# Patient Record
Sex: Female | Born: 1975 | Race: Black or African American | Hispanic: No | Smoking: Never smoker
Health system: Southern US, Community
[De-identification: ages and names within clinical notes are randomized; demographics above are authoritative.]

## PROBLEM LIST (undated history)

## (undated) DIAGNOSIS — J45909 Unspecified asthma, uncomplicated: Secondary | ICD-10-CM

## (undated) DIAGNOSIS — I82409 Acute embolism and thrombosis of unspecified deep veins of unspecified lower extremity: Secondary | ICD-10-CM

## (undated) DIAGNOSIS — E119 Type 2 diabetes mellitus without complications: Secondary | ICD-10-CM

## (undated) DIAGNOSIS — J40 Bronchitis, not specified as acute or chronic: Secondary | ICD-10-CM

## (undated) DIAGNOSIS — E669 Obesity, unspecified: Secondary | ICD-10-CM

## (undated) HISTORY — PX: HERNIA REPAIR: SHX51

---

## 1998-01-28 ENCOUNTER — Emergency Department (HOSPITAL_COMMUNITY): Admission: EM | Admit: 1998-01-28 | Discharge: 1998-01-29 | Payer: Self-pay | Admitting: Emergency Medicine

## 1998-11-08 ENCOUNTER — Emergency Department (HOSPITAL_COMMUNITY): Admission: EM | Admit: 1998-11-08 | Discharge: 1998-11-08 | Payer: Self-pay | Admitting: Emergency Medicine

## 1998-11-08 ENCOUNTER — Encounter: Payer: Self-pay | Admitting: Emergency Medicine

## 1999-02-23 ENCOUNTER — Encounter: Payer: Self-pay | Admitting: Emergency Medicine

## 1999-02-23 ENCOUNTER — Emergency Department (HOSPITAL_COMMUNITY): Admission: EM | Admit: 1999-02-23 | Discharge: 1999-02-23 | Payer: Self-pay | Admitting: Emergency Medicine

## 2001-09-13 ENCOUNTER — Emergency Department (HOSPITAL_COMMUNITY): Admission: EM | Admit: 2001-09-13 | Discharge: 2001-09-13 | Payer: Self-pay | Admitting: Emergency Medicine

## 2001-09-14 ENCOUNTER — Emergency Department (HOSPITAL_COMMUNITY): Admission: EM | Admit: 2001-09-14 | Discharge: 2001-09-14 | Payer: Self-pay | Admitting: Emergency Medicine

## 2003-06-03 ENCOUNTER — Observation Stay (HOSPITAL_COMMUNITY): Admission: AD | Admit: 2003-06-03 | Discharge: 2003-06-05 | Payer: Self-pay | Admitting: Family Medicine

## 2003-06-09 ENCOUNTER — Encounter: Admission: RE | Admit: 2003-06-09 | Discharge: 2003-06-09 | Payer: Self-pay | Admitting: Family Medicine

## 2003-11-27 ENCOUNTER — Emergency Department (HOSPITAL_COMMUNITY): Admission: EM | Admit: 2003-11-27 | Discharge: 2003-11-27 | Payer: Self-pay | Admitting: Emergency Medicine

## 2004-01-05 ENCOUNTER — Ambulatory Visit (HOSPITAL_COMMUNITY): Admission: RE | Admit: 2004-01-05 | Discharge: 2004-01-05 | Payer: Self-pay | Admitting: Family Medicine

## 2004-01-11 ENCOUNTER — Emergency Department (HOSPITAL_COMMUNITY): Admission: EM | Admit: 2004-01-11 | Discharge: 2004-01-12 | Payer: Self-pay

## 2004-07-19 ENCOUNTER — Emergency Department (HOSPITAL_COMMUNITY): Admission: EM | Admit: 2004-07-19 | Discharge: 2004-07-19 | Payer: Self-pay | Admitting: Emergency Medicine

## 2004-10-08 ENCOUNTER — Emergency Department (HOSPITAL_COMMUNITY): Admission: AD | Admit: 2004-10-08 | Discharge: 2004-10-08 | Payer: Self-pay | Admitting: Family Medicine

## 2005-03-20 ENCOUNTER — Emergency Department (HOSPITAL_COMMUNITY): Admission: EM | Admit: 2005-03-20 | Discharge: 2005-03-20 | Payer: Self-pay | Admitting: *Deleted

## 2005-04-04 ENCOUNTER — Other Ambulatory Visit: Admission: RE | Admit: 2005-04-04 | Discharge: 2005-04-04 | Payer: Self-pay | Admitting: Gynecology

## 2005-08-25 ENCOUNTER — Emergency Department (HOSPITAL_COMMUNITY): Admission: EM | Admit: 2005-08-25 | Discharge: 2005-08-26 | Payer: Self-pay | Admitting: Emergency Medicine

## 2006-01-18 ENCOUNTER — Emergency Department (HOSPITAL_COMMUNITY): Admission: EM | Admit: 2006-01-18 | Discharge: 2006-01-18 | Payer: Self-pay | Admitting: Emergency Medicine

## 2006-08-03 ENCOUNTER — Emergency Department (HOSPITAL_COMMUNITY): Admission: EM | Admit: 2006-08-03 | Discharge: 2006-08-03 | Payer: Self-pay | Admitting: Emergency Medicine

## 2006-08-23 ENCOUNTER — Emergency Department (HOSPITAL_COMMUNITY): Admission: EM | Admit: 2006-08-23 | Discharge: 2006-08-23 | Payer: Self-pay | Admitting: Emergency Medicine

## 2006-12-05 ENCOUNTER — Emergency Department: Payer: Self-pay | Admitting: Unknown Physician Specialty

## 2006-12-05 ENCOUNTER — Other Ambulatory Visit: Payer: Self-pay

## 2007-02-28 ENCOUNTER — Emergency Department: Payer: Self-pay | Admitting: Emergency Medicine

## 2007-04-06 ENCOUNTER — Emergency Department: Payer: Self-pay | Admitting: Emergency Medicine

## 2007-06-03 ENCOUNTER — Emergency Department: Payer: Self-pay | Admitting: Emergency Medicine

## 2013-09-25 ENCOUNTER — Emergency Department (HOSPITAL_COMMUNITY)
Admission: EM | Admit: 2013-09-25 | Discharge: 2013-09-26 | Disposition: A | Discharge status: Home / Self Care | Payer: Self-pay | Attending: Emergency Medicine | Admitting: Emergency Medicine

## 2013-09-25 ENCOUNTER — Encounter (HOSPITAL_COMMUNITY): Payer: Self-pay | Admitting: Emergency Medicine

## 2013-09-25 DIAGNOSIS — J302 Other seasonal allergic rhinitis: Secondary | ICD-10-CM

## 2013-09-25 DIAGNOSIS — E119 Type 2 diabetes mellitus without complications: Secondary | ICD-10-CM | POA: Insufficient documentation

## 2013-09-25 DIAGNOSIS — J40 Bronchitis, not specified as acute or chronic: Secondary | ICD-10-CM

## 2013-09-25 DIAGNOSIS — Z91199 Patient's noncompliance with other medical treatment and regimen due to unspecified reason: Secondary | ICD-10-CM | POA: Insufficient documentation

## 2013-09-25 DIAGNOSIS — J309 Allergic rhinitis, unspecified: Secondary | ICD-10-CM | POA: Insufficient documentation

## 2013-09-25 DIAGNOSIS — Z9119 Patient's noncompliance with other medical treatment and regimen: Secondary | ICD-10-CM | POA: Insufficient documentation

## 2013-09-25 DIAGNOSIS — Z880 Allergy status to penicillin: Secondary | ICD-10-CM | POA: Insufficient documentation

## 2013-09-25 DIAGNOSIS — Z79899 Other long term (current) drug therapy: Secondary | ICD-10-CM | POA: Insufficient documentation

## 2013-09-25 DIAGNOSIS — E669 Obesity, unspecified: Secondary | ICD-10-CM | POA: Insufficient documentation

## 2013-09-25 DIAGNOSIS — J45901 Unspecified asthma with (acute) exacerbation: Secondary | ICD-10-CM | POA: Insufficient documentation

## 2013-09-25 HISTORY — DX: Type 2 diabetes mellitus without complications: E11.9

## 2013-09-25 HISTORY — DX: Unspecified asthma, uncomplicated: J45.909

## 2013-09-25 HISTORY — DX: Obesity, unspecified: E66.9

## 2013-09-25 HISTORY — DX: Bronchitis, not specified as acute or chronic: J40

## 2013-09-25 MED ORDER — IPRATROPIUM BROMIDE 0.02 % IN SOLN
0.5000 mg | Freq: Once | RESPIRATORY_TRACT | Status: AC
  Administered 2013-09-26: 0.5 mg via RESPIRATORY_TRACT
  Filled 2013-09-25: qty 2.5

## 2013-09-25 MED ORDER — ALBUTEROL SULFATE (2.5 MG/3ML) 0.083% IN NEBU
5.0000 mg | INHALATION_SOLUTION | Freq: Once | RESPIRATORY_TRACT | Status: AC
  Administered 2013-09-26: 5 mg via RESPIRATORY_TRACT
  Filled 2013-09-25: qty 6

## 2013-09-25 MED ORDER — HYDROCOD POLST-CHLORPHEN POLST 10-8 MG/5ML PO LQCR
5.0000 mL | Freq: Once | ORAL | Status: AC
  Administered 2013-09-26: 5 mL via ORAL
  Filled 2013-09-25: qty 5

## 2013-09-25 NOTE — ED Notes (Signed)
Pt. reports productive cough with chest congestion , nasal congestion , itchy eyes and headache for several days .

## 2013-09-25 NOTE — ED Provider Notes (Signed)
CSN: [id number]     Arrival date & time 09/25/13  2221 History  This chart was scribed for non-physician practitioner, Marie Haring, PA-C working with Marie Greek, MD by Marie Spears, ED scribe. This patient was seen in room TR07C/TR07C and the patient's care was started at 11:42 PM.    Chief Complaint  Patient presents with  . Cough  . Nasal Congestion   The history is provided by the patient. No language interpreter was used.   HPI Comments: Marie Spears is a 38 y.o. Morbidly obese female with history of asthma who presents to the Emergency Department complaining of productive cough, chest congestion, nasal congestion, itchy eyes and headache that started 5 days ago. She is also starting to have a sore throat. Pt states she is visiting from [place] and left her inhaler at home. She denies having difficulty breathing, chest pains, abdominal pains, fatigue, recent weight gain or lower extremity swelling. + sneezing, + coughing, + nasal congestion  Past Medical History  Diagnosis Date  . Asthma   . Diabetes mellitus without complication   . Obesity   . Bronchitis    Past Surgical History  Procedure Laterality Date  . Hernia repair     No family history on file. History  Substance Use Topics  . Smoking status: Never Smoker   . Smokeless tobacco: Not on file  . Alcohol Use: No   OB History   Grav Para Term Preterm Abortions TAB SAB Ect Mult Living                 Review of Systems  HENT: Positive for congestion and sore throat.   Eyes: Positive for itching.  Respiratory: Positive for cough.   Neurological: Positive for headaches.  All other systems reviewed and are negative.   Allergies  Penicillins  Home Medications   Current Outpatient Rx  Name  Route  Sig  Dispense  Refill  . albuterol (PROVENTIL HFA;VENTOLIN HFA) 108 (90 BASE) MCG/ACT inhaler   Inhalation   Inhale 2 puffs into the lungs every 6 (six) hours as needed for wheezing or shortness  of breath.         . loratadine (CLARITIN) 10 MG tablet   Oral   Take 1 tablet (10 mg total) by mouth daily.   30 tablet   0   . sulfamethoxazole-trimethoprim (BACTRIM) 400-80 MG per tablet   Oral   Take 1 tablet by mouth 2 (two) times daily.   14 tablet   0     BP 136/78  Pulse 88  Temp(Src) 98.7 F (37.1 C) (Oral)  Resp 14  Ht 6' (1.829 m)  Wt 351 lb (159.213 kg)  BMI 47.59 kg/m2  SpO2 98%  LMP 09/15/2013  Physical Exam  Nursing note and vitals reviewed. Constitutional: She is oriented to person, place, and time. She appears well-developed and well-nourished. No distress.  HENT:  Head: Normocephalic and atraumatic.  Right Ear: Tympanic membrane and ear canal normal.  Left Ear: Tympanic membrane and ear canal normal.  Nose: Rhinorrhea present.  Mouth/Throat: Uvula is midline and mucous membranes are normal. Posterior oropharyngeal erythema present. No oropharyngeal exudate or posterior oropharyngeal edema.  Eyes: EOM are normal.  Neck: Neck supple. No tracheal deviation present.  Cardiovascular: Normal rate, regular rhythm and normal heart sounds.   Pulmonary/Chest: Effort normal. No respiratory distress. She has wheezes (mild diffuse). She has no rales.  Abdominal: Bowel sounds are normal. There is no tenderness. There is no rebound.  Musculoskeletal: Normal range of motion.  No lower extremity swelling.   Neurological: She is alert and oriented to person, place, and time.  Skin: Skin is warm and dry.  Psychiatric: She has a normal mood and affect. Her behavior is normal.    ED Course  Procedures (including critical care time)  DIAGNOSTIC STUDIES: Oxygen Saturation is 98% on RA, normal by my interpretation.    COORDINATION OF CARE: 11:44 PM-Discussed treatment plan which includes a breathing treatment and cough medicine with pt at bedside and pt agreed to plan.   Labs Review Labs Reviewed - No data to display Imaging Review No results found.   EKG  Interpretation None      MDM   Final diagnoses:  Asthma exacerbation  Seasonal allergies  Bronchitis    Pt given a breathing treatment in the ER. She is a diabetic and reports being non compliant and not checking her sugar at home. She also did not bring her glucometer, therefore, since her wheezing is mild, no prednisone given. She was given two breathing treatment. The first one relieved the wheezing, the patient did not medically need the second treatment but she requested it.  Pt has the coughing and allergy symptoms but also describes URI, due to her being diabetic and out of town, will place on abx. She is allergic to Penicillin and reports she is a self pay and needs abx for money reasons. This is why Bactrim was chosen. Albuterol inhaler and claritin, Rx.    38 y.o.Marie Spears's evaluation in the Emergency Department is complete. It has been determined that no acute conditions requiring further emergency intervention are present at this time. The patient/guardian have been advised of the diagnosis and plan. We have discussed signs and symptoms that warrant return to the ED, such as changes or worsening in symptoms.  Vital signs are stable at discharge. Filed Vitals:   09/26/13 0112  BP: 124/62  Pulse: 84  Temp: 97.9 F (36.6 C)  Resp: 22    Patient/guardian has voiced understanding and agreed to follow-up with the PCP or specialist.  I personally performed the services described in this documentation, which was scribed in my presence. The recorded information has been reviewed and is accurate.  Marie Mako, PA-C 09/29/13 1008

## 2013-09-26 MED ORDER — LORATADINE 10 MG PO TABS: 10.0000 mg | ORAL_TABLET | Freq: Every day | ORAL | Status: DC

## 2013-09-26 MED ORDER — ALBUTEROL SULFATE HFA 108 (90 BASE) MCG/ACT IN AERS
2.0000 | INHALATION_SPRAY | RESPIRATORY_TRACT | Status: DC | PRN
  Administered 2013-09-26: 2 via RESPIRATORY_TRACT
  Filled 2013-09-26: qty 6.7

## 2013-09-26 MED ORDER — IPRATROPIUM BROMIDE 0.02 % IN SOLN
0.5000 mg | Freq: Once | RESPIRATORY_TRACT | Status: AC
  Administered 2013-09-26: 0.5 mg via RESPIRATORY_TRACT
  Filled 2013-09-26: qty 2.5

## 2013-09-26 MED ORDER — ALBUTEROL SULFATE (2.5 MG/3ML) 0.083% IN NEBU
5.0000 mg | INHALATION_SOLUTION | Freq: Once | RESPIRATORY_TRACT | Status: AC
  Administered 2013-09-26: 5 mg via RESPIRATORY_TRACT
  Filled 2013-09-26: qty 6

## 2013-09-26 MED ORDER — DEXAMETHASONE SODIUM PHOSPHATE 10 MG/ML IJ SOLN
10.0000 mg | Freq: Once | INTRAMUSCULAR | Status: AC
  Administered 2013-09-26: 10 mg via INTRAMUSCULAR
  Filled 2013-09-26: qty 1

## 2013-09-26 MED ORDER — SULFAMETHOXAZOLE-TRIMETHOPRIM 400-80 MG PO TABS: 1.0000 | ORAL_TABLET | Freq: Two times a day (BID) | ORAL | Status: DC

## 2013-09-26 NOTE — Discharge Instructions (Signed)
Upper Respiratory Infection, Adult An upper respiratory infection (URI) is also sometimes known as the common cold. The upper respiratory tract includes the nose, sinuses, throat, trachea, and bronchi. Bronchi are the airways leading to the lungs. Most people improve within 1 week, but symptoms can last up to 2 weeks. A residual cough may last even longer.  CAUSES Many different viruses can infect the tissues lining the upper respiratory tract. The tissues become irritated and inflamed and often become very moist. Mucus production is also common. A cold is contagious. You can easily spread the virus to others by oral contact. This includes kissing, sharing a glass, coughing, or sneezing. Touching your mouth or nose and then touching a surface, which is then touched by another person, can also spread the virus. SYMPTOMS  Symptoms typically develop 1 to 3 days after you come in contact with a cold virus. Symptoms vary from person to person. They may include:  Runny nose.  Sneezing.  Nasal congestion.  Sinus irritation.  Sore throat.  Loss of voice (laryngitis).  Cough.  Fatigue.  Muscle aches.  Loss of appetite.  Headache.  Low-grade fever. DIAGNOSIS  You might diagnose your own cold based on familiar symptoms, since most people get a cold 2 to 3 times a year. Your caregiver can confirm this based on your exam. Most importantly, your caregiver can check that your symptoms are not due to another disease such as strep throat, sinusitis, pneumonia, asthma, or epiglottitis. Blood tests, throat tests, and X-rays are not necessary to diagnose a common cold, but they may sometimes be helpful in excluding other more serious diseases. Your caregiver will decide if any further tests are required. RISKS AND COMPLICATIONS  You may be at risk for a more severe case of the common cold if you smoke cigarettes, have chronic heart disease (such as heart failure) or lung disease (such as asthma), or if  you have a weakened immune system. The very young and very old are also at risk for more serious infections. Bacterial sinusitis, middle ear infections, and bacterial pneumonia can complicate the common cold. The common cold can worsen asthma and chronic obstructive pulmonary disease (COPD). Sometimes, these complications can require emergency medical care and may be life-threatening. PREVENTION  The best way to protect against getting a cold is to practice good hygiene. Avoid oral or hand contact with people with cold symptoms. Wash your hands often if contact occurs. There is no clear evidence that vitamin C, vitamin E, echinacea, or exercise reduces the chance of developing a cold. However, it is always recommended to get plenty of rest and practice good nutrition. TREATMENT  Treatment is directed at relieving symptoms. There is no cure. Antibiotics are not effective, because the infection is caused by a virus, not by bacteria. Treatment may include:  Increased fluid intake. Sports drinks offer valuable electrolytes, sugars, and fluids.  Breathing heated mist or steam (vaporizer or shower).  Eating chicken soup or other clear broths, and maintaining good nutrition.  Getting plenty of rest.  Using gargles or lozenges for comfort.  Controlling fevers with ibuprofen or acetaminophen as directed by your caregiver.  Increasing usage of your inhaler if you have asthma. Zinc gel and zinc lozenges, taken in the first 24 hours of the common cold, can shorten the duration and lessen the severity of symptoms. Pain medicines may help with fever, muscle aches, and throat pain. A variety of non-prescription medicines are available to treat congestion and runny nose. Your caregiver  duration and lessen the severity of symptoms. Pain medicines may help with fever, muscle aches, and throat pain. A variety of non-prescription medicines are available to treat congestion and runny nose. Your caregiver can make recommendations and may suggest nasal or lung inhalers for other symptoms.   HOME CARE INSTRUCTIONS    Only take over-the-counter or prescription medicines for pain, discomfort, or fever as directed by your  caregiver.   Use a warm mist humidifier or inhale steam from a shower to increase air moisture. This may keep secretions moist and make it easier to breathe.   Drink enough water and fluids to keep your urine clear or pale yellow.   Rest as needed.   Return to work when your temperature has returned to normal or as your caregiver advises. You may need to stay home longer to avoid infecting others. You can also use a face mask and careful hand washing to prevent spread of the virus.  SEEK MEDICAL CARE IF:    After the first few days, you feel you are getting worse rather than better.   You need your caregiver's advice about medicines to control symptoms.   You develop chills, worsening shortness of breath, or brown or red sputum. These may be signs of pneumonia.   You develop yellow or brown nasal discharge or pain in the face, especially when you bend forward. These may be signs of sinusitis.   You develop a fever, swollen neck glands, pain with swallowing, or white areas in the back of your throat. These may be signs of strep throat.  SEEK IMMEDIATE MEDICAL CARE IF:    You have a fever.   You develop severe or persistent headache, ear pain, sinus pain, or chest pain.   You develop wheezing, a prolonged cough, cough up blood, or have a change in your usual mucus (if you have chronic lung disease).   You develop sore muscles or a stiff neck.  Document Released: 12/06/2000 Document Revised: 09/04/2011 Document Reviewed: 10/14/2010  [employer] Patient Information 2014 [employer]  Asthma, Adult  Asthma is a recurring condition in which the airways tighten and narrow. Asthma can make it difficult to breathe. It can cause coughing, wheezing, and shortness of breath. Asthma episodes (also called asthma attacks) range from minor to life-threatening. Asthma cannot be cured, but medicines and lifestyle changes can help control it.  CAUSES  Asthma is believed to be caused by inherited (genetic) and  environmental factors, but its exact cause is unknown. Asthma may be triggered by allergens, lung infections, or irritants in the air. Asthma triggers are different for each person. Common triggers include:    Animal dander.   Dust mites.   Cockroaches.   Pollen from trees or grass.   Mold.   Smoke.   Air pollutants such as dust, household cleaners, hair sprays, aerosol sprays, paint fumes, strong chemicals, or strong odors.   Cold air, weather changes, and winds (which increase molds and pollens in the air).   Strong emotional expressions such as crying or laughing hard.   Stress.   Certain medicines (such as aspirin) or types of drugs (such as beta-blockers).   Sulfites in foods and drinks. Foods and drinks that may contain sulfites include dried fruit, potato chips, and sparkling grape juice.   Infections or inflammatory conditions such as the flu, a cold, or an inflammation of the nasal membranes (rhinitis).   Gastroesophageal reflux disease (GERD).   Exercise or strenuous activity.  SYMPTOMS  Symptoms   much air you breath in and out.  Allergy tests.  Imaging tests such as X-rays. TREATMENT  Asthma cannot be cured, but it can usually be controlled. Treatment involves identifying and avoiding your asthma triggers. It also involves medicines. There are 2 classes of medicine used for asthma treatment:   Controller medicines. These prevent asthma symptoms from occurring. They are usually taken every day.  Reliever or rescue medicines. These quickly relieve  asthma symptoms. They are used as needed and provide short-term relief. Your health care provider will help you create an asthma action plan. An asthma action plan is a written plan for managing and treating your asthma attacks. It includes a list of your asthma triggers and how they may be avoided. It also includes information on when medicines should be taken and when their dosage should be changed. An action plan may also involve the use of a device called a peak flow meter. A peak flow meter measures how well the lungs are working. It helps you monitor your condition. HOME CARE INSTRUCTIONS   Take medicine as directed by your health care provider. Speak with your health care provider if you have questions about how or when to take the medicines.  Use a peak flow meter as directed by your health care provider. Record and keep track of readings.  Understand and use the action plan to help minimize or stop an asthma attack without needing to seek medical care.  Control your home environment in the following ways to help prevent asthma attacks:  Do not smoke. Avoid being exposed to secondhand smoke.  Change your heating and air conditioning filter regularly.  Limit your use of fireplaces and wood stoves.  Get rid of pests (such as roaches and mice) and their droppings.  Throw away plants if you see mold on them.  Clean your floors and dust regularly. Use unscented cleaning products.  Try to have someone else vacuum for you regularly. Stay out of rooms while they are being vacuumed and for a short while afterward. If you vacuum, use a dust mask from a hardware store, a double-layered or microfilter vacuum cleaner bag, or a vacuum cleaner with a HEPA filter.  Replace carpet with wood, tile, or vinyl flooring. Carpet can trap dander and dust.  Use allergy-proof pillows, mattress covers, and box spring covers.  Wash bed sheets and blankets every week in hot water and dry them in a  dryer.  Use blankets that are made of polyester or cotton.  Clean bathrooms and kitchens with bleach. If possible, have someone repaint the walls in these rooms with mold-resistant paint. Keep out of the rooms that are being cleaned and painted.  Wash hands frequently. SEEK MEDICAL CARE IF:   You have wheezing, shortness of breath, or a cough even if taking medicine to prevent attacks.  The colored mucus you cough up (sputum) is thicker than usual.  Your sputum changes from clear or white to yellow, green, gray, or bloody.  You have any problems that may be related to the medicines you are taking (such as a rash, itching, swelling, or trouble breathing).  You are using a reliever medicine more than 2 3 times per week.  Your peak flow is still at 50 79% of you personal best after following your action plan for 1 hour. SEEK IMMEDIATE MEDICAL CARE IF:   You seem to be getting worse and are unresponsive to treatment during an asthma attack.  You are short of breath even  at rest.  You get short of breath when doing very little physical activity.  You have difficulty eating, drinking, or talking due to asthma symptoms.  You develop chest pain.  You develop a fast heartbeat.  You have a bluish color to your lips or fingernails.  You are lightheaded, dizzy, or faint.  Your peak flow is less than 50% of your personal best.  You have a fever or persistent symptoms for more than 2 3 days.  You have a fever and symptoms suddenly get worse. MAKE SURE YOU:   Understand these instructions.  Will watch your condition.  Will get help right away if you are not doing well or get worse. Document Released: 06/12/2005 Document Revised: 02/12/2013 Document Reviewed: 01/09/2013 [facility] Patient Information 2014 [place], [place].

## 2013-09-26 NOTE — ED Notes (Signed)
Pt. Ambulated with steady gait, SPO2 stayed at 98%

## 2013-10-06 NOTE — ED Provider Notes (Signed)
Medical screening examination/treatment/procedure(s) were performed by non-physician practitioner and as supervising physician I was immediately available for consultation/collaboration.   EKG Interpretation None        Orpah Greek, MD 10/06/13 [phone]

## 2014-08-18 ENCOUNTER — Emergency Department (HOSPITAL_COMMUNITY): Payer: Self-pay

## 2014-08-18 ENCOUNTER — Emergency Department (HOSPITAL_COMMUNITY)
Admission: EM | Admit: 2014-08-18 | Discharge: 2014-08-18 | Disposition: A | Discharge status: Home / Self Care | Payer: Self-pay | Attending: Emergency Medicine | Admitting: Emergency Medicine

## 2014-08-18 ENCOUNTER — Encounter (HOSPITAL_COMMUNITY): Payer: Self-pay | Admitting: Physical Medicine and Rehabilitation

## 2014-08-18 DIAGNOSIS — S92512A Displaced fracture of proximal phalanx of left lesser toe(s), initial encounter for closed fracture: Secondary | ICD-10-CM | POA: Insufficient documentation

## 2014-08-18 DIAGNOSIS — Z792 Long term (current) use of antibiotics: Secondary | ICD-10-CM | POA: Insufficient documentation

## 2014-08-18 DIAGNOSIS — Y998 Other external cause status: Secondary | ICD-10-CM | POA: Insufficient documentation

## 2014-08-18 DIAGNOSIS — Y9389 Activity, other specified: Secondary | ICD-10-CM | POA: Insufficient documentation

## 2014-08-18 DIAGNOSIS — W2209XA Striking against other stationary object, initial encounter: Secondary | ICD-10-CM | POA: Insufficient documentation

## 2014-08-18 DIAGNOSIS — E669 Obesity, unspecified: Secondary | ICD-10-CM | POA: Insufficient documentation

## 2014-08-18 DIAGNOSIS — S92912A Unspecified fracture of left toe(s), initial encounter for closed fracture: Secondary | ICD-10-CM

## 2014-08-18 DIAGNOSIS — J45909 Unspecified asthma, uncomplicated: Secondary | ICD-10-CM | POA: Insufficient documentation

## 2014-08-18 DIAGNOSIS — E119 Type 2 diabetes mellitus without complications: Secondary | ICD-10-CM | POA: Insufficient documentation

## 2014-08-18 DIAGNOSIS — Z79899 Other long term (current) drug therapy: Secondary | ICD-10-CM | POA: Insufficient documentation

## 2014-08-18 DIAGNOSIS — Z880 Allergy status to penicillin: Secondary | ICD-10-CM | POA: Insufficient documentation

## 2014-08-18 DIAGNOSIS — Y9289 Other specified places as the place of occurrence of the external cause: Secondary | ICD-10-CM | POA: Insufficient documentation

## 2014-08-18 MED ORDER — HYDROCODONE-ACETAMINOPHEN 5-325 MG PO TABS: 1.0000 | ORAL_TABLET | Freq: Four times a day (QID) | ORAL | Status: DC | PRN

## 2014-08-18 NOTE — Discharge Instructions (Signed)

## 2014-08-18 NOTE — ED Provider Notes (Signed)
CSN: [id number]     Arrival date & time 08/18/14  1357 History  This chart was scribed for non-physician practitioner, Montine Circle, PA-C working with Wandra Arthurs, MD by Frederich Balding, ED scribe. This patient was seen in room TR05C/TR05C and the patient's care was started at 2:20 PM.    Chief Complaint  Patient presents with  . Toe Pain   The history is provided by the patient. No language interpreter was used.    HPI Comments: Marie Spears is a 39 y.o. female who presents to the Emergency Department complaining of sudden onset left fourth toe pain with associated swelling that started around 5:40 AM today after hitting it on a bedpost. Bearing weight and palpation worsen pain. She has buddy taped her toes but not yet taken any medications.   Past Medical History  Diagnosis Date  . Asthma   . Diabetes mellitus without complication   . Obesity   . Bronchitis    Past Surgical History  Procedure Laterality Date  . Hernia repair     No family history on file. History  Substance Use Topics  . Smoking status: Never Smoker   . Smokeless tobacco: Not on file  . Alcohol Use: No   OB History    No data available     Review of Systems  Constitutional: Negative for fever.  HENT: Negative for congestion.   Eyes: Negative for redness.  Respiratory: Negative for shortness of breath.   Cardiovascular: Negative for chest pain.  Gastrointestinal: Negative for abdominal distention.  Musculoskeletal: Positive for joint swelling and arthralgias.  Skin: Negative for rash.  Neurological: Negative for speech difficulty.  Psychiatric/Behavioral: Negative for confusion.   Allergies  Penicillins  Home Medications   Prior to Admission medications   Medication Sig Start Date End Date Taking? Authorizing Provider  albuterol (PROVENTIL HFA;VENTOLIN HFA) 108 (90 BASE) MCG/ACT inhaler Inhale 2 puffs into the lungs every 6 (six) hours as needed for wheezing or shortness of breath.     Historical Provider, MD  loratadine (CLARITIN) 10 MG tablet Take 1 tablet (10 mg total) by mouth daily. 09/26/13   Tiffany Marilu Favre, PA-C  sulfamethoxazole-trimethoprim (BACTRIM) 400-80 MG per tablet Take 1 tablet by mouth 2 (two) times daily. 09/26/13   Tiffany Marilu Favre, PA-C   BP 137/65 mmHg  Pulse 76  Temp(Src) 97.7 F (36.5 C) (Oral)  Resp 16  Ht 6' (1.829 m)  Wt 343 lb (155.584 kg)  BMI 46.51 kg/m2  SpO2 98%   Physical Exam  Constitutional: She is oriented to person, place, and time. She appears well-developed and well-nourished. No distress.  HENT:  Head: Normocephalic and atraumatic.  Eyes: Conjunctivae and EOM are normal.  Cardiovascular: Normal rate, regular rhythm and intact distal pulses.   Pulmonary/Chest: Effort normal and breath sounds normal. No stridor. No respiratory distress.  Abdominal: She exhibits no distension.  Musculoskeletal: She exhibits no edema.  Left fourth toe tender to palpation. Moderately swollen. Appears slightly angulated. No open fracture.   Neurological: She is alert and oriented to person, place, and time. No cranial nerve deficit.  Sensation intact.  Skin: Skin is warm and dry.  Psychiatric: She has a normal mood and affect.  Nursing note and vitals reviewed.   ED Course  Procedures (including critical care time)  DIAGNOSTIC STUDIES: Oxygen Saturation is 98% on RA, normal by my interpretation.    COORDINATION OF CARE: 2:21 PM-Discussed treatment plan which includes xray with pt at bedside and pt  agreed to plan.   Labs Review Labs Reviewed - No data to display  Imaging Review Dg Foot Complete Left  08/18/2014   CLINICAL DATA:  Acute left foot pain after head landed on it. Initial encounter.  EXAM: LEFT FOOT - COMPLETE 3+ VIEW  COMPARISON:  July 19, 2004.  FINDINGS: Minimally displaced oblique fracture is seen involving the fourth proximal phalanx. This appears to be closed and posttraumatic. Moderate spurring of posterior calcaneus is  noted. Mild osteophyte formation is seen involving intercarpal joints.  IMPRESSION: Mildly displaced oblique fracture of the fourth proximal phalanx.   Electronically Signed   By: Marijo Conception, M.D.   On: 08/18/2014 15:25     EKG Interpretation None      MDM   Final diagnoses:  Toe fracture, left, closed, initial encounter    Patient with left fourth toe fracture. It is a closed fracture. Minimally displaced, will apply buddy tape and postop shoe. We'll treat patient's pain. Recommend rice therapy. Patient understands agrees to plan. She is stable and ready for discharge.  I personally performed the services described in this documentation, which was scribed in my presence. The recorded information has been reviewed and is accurate.    Montine Circle, PA-C 08/18/14 Ruma Yao, MD 08/18/14 [phone]

## 2014-08-18 NOTE — ED Notes (Signed)
Pt presents to department for evaluation of L 4th toe pain. Pt states she struck toe on platform bed. Obvious deformity noted. 8/10 pain upon arrival to ED.

## 2014-09-28 ENCOUNTER — Emergency Department (HOSPITAL_BASED_OUTPATIENT_CLINIC_OR_DEPARTMENT_OTHER): Payer: Managed Care, Other (non HMO)

## 2014-09-28 ENCOUNTER — Encounter (HOSPITAL_BASED_OUTPATIENT_CLINIC_OR_DEPARTMENT_OTHER): Payer: Self-pay | Admitting: Emergency Medicine

## 2014-09-28 DIAGNOSIS — J45901 Unspecified asthma with (acute) exacerbation: Secondary | ICD-10-CM | POA: Insufficient documentation

## 2014-09-28 DIAGNOSIS — R05 Cough: Secondary | ICD-10-CM | POA: Diagnosis present

## 2014-09-28 DIAGNOSIS — E119 Type 2 diabetes mellitus without complications: Secondary | ICD-10-CM | POA: Diagnosis not present

## 2014-09-28 DIAGNOSIS — Z880 Allergy status to penicillin: Secondary | ICD-10-CM | POA: Diagnosis not present

## 2014-09-28 DIAGNOSIS — Z79899 Other long term (current) drug therapy: Secondary | ICD-10-CM | POA: Insufficient documentation

## 2014-09-28 DIAGNOSIS — J069 Acute upper respiratory infection, unspecified: Secondary | ICD-10-CM | POA: Insufficient documentation

## 2014-09-28 DIAGNOSIS — Z792 Long term (current) use of antibiotics: Secondary | ICD-10-CM | POA: Insufficient documentation

## 2014-09-28 DIAGNOSIS — E669 Obesity, unspecified: Secondary | ICD-10-CM | POA: Insufficient documentation

## 2014-09-28 DIAGNOSIS — Z3202 Encounter for pregnancy test, result negative: Secondary | ICD-10-CM | POA: Insufficient documentation

## 2014-09-28 LAB — PREGNANCY, URINE: Preg Test, Ur: NEGATIVE

## 2014-09-28 NOTE — ED Notes (Signed)
SOB and productive cough x1 week. Hx of asthma

## 2014-09-29 ENCOUNTER — Emergency Department (HOSPITAL_BASED_OUTPATIENT_CLINIC_OR_DEPARTMENT_OTHER)
Admission: EM | Admit: 2014-09-29 | Discharge: 2014-09-29 | Disposition: A | Discharge status: Home / Self Care | Payer: Managed Care, Other (non HMO) | Attending: Emergency Medicine | Admitting: Emergency Medicine

## 2014-09-29 DIAGNOSIS — J069 Acute upper respiratory infection, unspecified: Secondary | ICD-10-CM

## 2014-09-29 DIAGNOSIS — R05 Cough: Secondary | ICD-10-CM

## 2014-09-29 DIAGNOSIS — R059 Cough, unspecified: Secondary | ICD-10-CM

## 2014-09-29 MED ORDER — PREDNISONE 20 MG PO TABS: 60.0000 mg | ORAL_TABLET | Freq: Every day | ORAL | Status: DC

## 2014-09-29 MED ORDER — HYDROCOD POLST-CHLORPHEN POLST 10-8 MG/5ML PO LQCR: 5.0000 mL | Freq: Two times a day (BID) | ORAL | Status: DC | PRN

## 2014-09-29 MED ORDER — ALBUTEROL SULFATE HFA 108 (90 BASE) MCG/ACT IN AERS
2.0000 | INHALATION_SPRAY | Freq: Once | RESPIRATORY_TRACT | Status: AC
  Administered 2014-09-29: 2 via RESPIRATORY_TRACT
  Filled 2014-09-29: qty 6.7

## 2014-09-29 NOTE — ED Provider Notes (Signed)
CSN: [id number]     Arrival date & time 09/28/14  2300 History   First MD Initiated Contact with Patient 09/29/14 0141     Chief Complaint  Patient presents with  . Cough     (Consider location/radiation/quality/duration/timing/severity/associated sxs/prior Treatment) Patient is a 39 y.o. female presenting with cough.  Cough Cough characteristics:  Non-productive and productive Sputum characteristics:  Unable to specify Severity:  Moderate Onset quality:  Gradual Duration:  1 week Timing:  Constant Progression:  Unchanged Chronicity:  New Smoker: no   Context: upper respiratory infection   Relieved by:  Beta-agonist inhaler Worsened by:  Deep breathing and activity Ineffective treatments:  None tried Associated symptoms: rhinorrhea, shortness of breath (chronically with wheezing, intermittent symptoms), sinus congestion and wheezing   Associated symptoms: no chest pain, no chills and no fever     Past Medical History  Diagnosis Date  . Asthma   . Diabetes mellitus without complication   . Obesity   . Bronchitis    Past Surgical History  Procedure Laterality Date  . Hernia repair     No family history on file. History  Substance Use Topics  . Smoking status: Never Smoker   . Smokeless tobacco: Not on file  . Alcohol Use: No   OB History    No data available     Review of Systems  Constitutional: Negative for fever and chills.  HENT: Positive for rhinorrhea.   Respiratory: Positive for cough, shortness of breath (chronically with wheezing, intermittent symptoms) and wheezing.   Cardiovascular: Negative for chest pain.  All other systems reviewed and are negative.     Allergies  Penicillins  Home Medications   Prior to Admission medications   Medication Sig Start Date End Date Taking? Authorizing Provider  gabapentin (NEURONTIN) 300 MG capsule Take 300 mg by mouth as needed.   Yes Historical Provider, MD  GLIPIZIDE PO Take by mouth.   Yes Historical  Provider, MD  lisinopril (PRINIVIL,ZESTRIL) 5 MG tablet Take 5 mg by mouth daily.   Yes Historical Provider, MD  metFORMIN (GLUCOPHAGE) 1000 MG tablet Take 1,000 mg by mouth daily.   Yes Historical Provider, MD  albuterol (PROVENTIL HFA;VENTOLIN HFA) 108 (90 BASE) MCG/ACT inhaler Inhale 2 puffs into the lungs every 6 (six) hours as needed for wheezing or shortness of breath.    Historical Provider, MD  chlorpheniramine-HYDROcodone (TUSSIONEX PENNKINETIC ER) 10-8 MG/5ML LQCR Take 5 mLs by mouth every 12 (twelve) hours as needed for cough. 09/29/14   Debby Freiberg, MD  HYDROcodone-acetaminophen (NORCO/VICODIN) 5-325 MG per tablet Take 1-2 tablets by mouth every 6 (six) hours as needed. 08/18/14   Montine Circle, PA-C  loratadine (CLARITIN) 10 MG tablet Take 1 tablet (10 mg total) by mouth daily. 09/26/13   Tiffany Carlota Raspberry, PA-C  predniSONE (DELTASONE) 20 MG tablet Take 3 tablets (60 mg total) by mouth daily. 09/29/14   Debby Freiberg, MD  sulfamethoxazole-trimethoprim (BACTRIM) 400-80 MG per tablet Take 1 tablet by mouth 2 (two) times daily. 09/26/13   Tiffany Carlota Raspberry, PA-C   BP 128/71 mmHg  Pulse 84  Temp(Src) 98.5 F (36.9 C) (Oral)  Resp 18  Ht 6' (1.829 m)  Wt 355 lb (161.027 kg)  BMI 48.14 kg/m2  SpO2 95%  LMP 09/01/2014 (Approximate) Physical Exam  Constitutional: She is oriented to person, place, and time. She appears well-developed and well-nourished.  HENT:  Head: Normocephalic and atraumatic.  Right Ear: External ear normal.  Left Ear: External ear normal.  Mouth/Throat: Oropharynx  is clear and moist and mucous membranes are normal.  Eyes: Conjunctivae and EOM are normal. Pupils are equal, round, and reactive to light.  Neck: Normal range of motion. Neck supple.  Cardiovascular: Normal rate, regular rhythm, normal heart sounds and intact distal pulses.   Pulmonary/Chest: Effort normal and breath sounds normal.  Abdominal: Soft. Bowel sounds are normal. There is no tenderness.   Musculoskeletal: Normal range of motion.  Neurological: She is alert and oriented to person, place, and time.  Skin: Skin is warm and dry.  Vitals reviewed.   ED Course  Procedures (including critical care time) Labs Review Labs Reviewed  PREGNANCY, URINE    Imaging Review Dg Chest 2 View  09/29/2014   CLINICAL DATA:  Cough  EXAM: CHEST  2 VIEW  COMPARISON:  03/20/2005  FINDINGS: Normal heart size and mediastinal contours. No acute infiltrate or edema. No effusion or pneumothorax. No acute osseous findings.  IMPRESSION: No evidence of acute cardiopulmonary disease.   Electronically Signed   By: Monte Fantasia M.D.   On: 09/29/2014 00:04     EKG Interpretation None      MDM   Final diagnoses:  Cough  Upper respiratory infection    39 y.o. female with pertinent PMH of asthma presents with cough x 1 week.  Pt denies dyspnea, chest pain, leg swelling, or other concerning history.  No hemoptysis.  On arrival vitals and physical exam as above.  Benign exam, no hypoxia, tachycardia.  CXR unremarkable.  Likely URI.  Will dc with prednisone burst, tussionex.  DC home in stable condition with strict return precautions for dyspnea or change in symptoms.    I have reviewed all laboratory and imaging studies if ordered as above  1. Cough   2. Upper respiratory infection         Debby Freiberg, MD 09/29/14 [phone]

## 2014-09-29 NOTE — Patient Instructions (Signed)
Instructed patient on the proper use of administering albuterol mdi via aerochamber patient tolerated well 

## 2014-09-29 NOTE — Discharge Instructions (Signed)
Cough, Adult  A cough is a reflex that helps clear your throat and airways. It can help heal the body or may be a reaction to an irritated airway. A cough may only last 2 or 3 weeks (acute) or may last more than 8 weeks (chronic).  CAUSES Acute cough:  Viral or bacterial infections. Chronic cough:  Infections.  Allergies.  Asthma.  Post-nasal drip.  Smoking.  Heartburn or acid reflux.  Some medicines.  Chronic lung problems (COPD).  Cancer. SYMPTOMS   Cough.  Fever.  Chest pain.  Increased breathing rate.  High-pitched whistling sound when breathing (wheezing).  Colored mucus that you cough up (sputum). TREATMENT   A bacterial cough may be treated with antibiotic medicine.  A viral cough must run its course and will not respond to antibiotics.  Your caregiver may recommend other treatments if you have a chronic cough. HOME CARE INSTRUCTIONS   Only take over-the-counter or prescription medicines for pain, discomfort, or fever as directed by your caregiver. Use cough suppressants only as directed by your caregiver.  Use a cold steam vaporizer or humidifier in your bedroom or home to help loosen secretions.  Sleep in a semi-upright position if your cough is worse at night.  Rest as needed.  Stop smoking if you smoke. SEEK IMMEDIATE MEDICAL CARE IF:   You have pus in your sputum.  Your cough starts to worsen.  You cannot control your cough with suppressants and are losing sleep.  You begin coughing up blood.  You have difficulty breathing.  You develop pain which is getting worse or is uncontrolled with medicine.  You have a fever. MAKE SURE YOU:   Understand these instructions.  Will watch your condition.  Will get help right away if you are not doing well or get worse. Document Released: 12/09/2010 Document Revised: 09/04/2011 Document Reviewed: 12/09/2010 [employer] Patient Information 2015 [employer]. This information is not intended  to replace advice given to you by your health care provider. Make sure you discuss any questions you have with your health care provider.  

## 2014-12-17 ENCOUNTER — Emergency Department (HOSPITAL_COMMUNITY)
Admission: EM | Admit: 2014-12-17 | Discharge: 2014-12-17 | Disposition: A | Discharge status: Home / Self Care | Payer: Managed Care, Other (non HMO) | Source: Home / Self Care | Attending: Family Medicine | Admitting: Family Medicine

## 2014-12-17 ENCOUNTER — Encounter (HOSPITAL_COMMUNITY): Payer: Self-pay | Admitting: Emergency Medicine

## 2014-12-17 ENCOUNTER — Other Ambulatory Visit (HOSPITAL_COMMUNITY)
Admission: RE | Admit: 2014-12-17 | Discharge: 2014-12-17 | Disposition: A | Discharge status: Home / Self Care | Payer: Managed Care, Other (non HMO) | Source: Ambulatory Visit | Attending: Family Medicine | Admitting: Family Medicine

## 2014-12-17 DIAGNOSIS — Z113 Encounter for screening for infections with a predominantly sexual mode of transmission: Secondary | ICD-10-CM | POA: Insufficient documentation

## 2014-12-17 DIAGNOSIS — Z202 Contact with and (suspected) exposure to infections with a predominantly sexual mode of transmission: Secondary | ICD-10-CM

## 2014-12-17 DIAGNOSIS — N760 Acute vaginitis: Secondary | ICD-10-CM | POA: Diagnosis present

## 2014-12-17 DIAGNOSIS — E119 Type 2 diabetes mellitus without complications: Secondary | ICD-10-CM | POA: Diagnosis not present

## 2014-12-17 LAB — POCT URINALYSIS DIP (DEVICE)
Bilirubin Urine: NEGATIVE
Glucose, UA: 250 mg/dL — AB
HGB URINE DIPSTICK: NEGATIVE
KETONES UR: NEGATIVE mg/dL
Nitrite: NEGATIVE
PH: 5 (ref 5.0–8.0)
PROTEIN: 30 mg/dL — AB
Urobilinogen, UA: 0.2 mg/dL (ref 0.0–1.0)

## 2014-12-17 LAB — POCT PREGNANCY, URINE: PREG TEST UR: NEGATIVE

## 2014-12-17 MED ORDER — METRONIDAZOLE 500 MG PO TABS: 500.0000 mg | ORAL_TABLET | Freq: Two times a day (BID) | ORAL | Status: DC
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
## 2014-12-18 LAB — HIV ANTIBODY (ROUTINE TESTING W REFLEX): HIV Screen 4th Generation wRfx: NONREACTIVE

## 2014-12-18 LAB — CERVICOVAGINAL ANCILLARY ONLY
Chlamydia: NEGATIVE
Neisseria Gonorrhea: NEGATIVE
WET PREP (BD AFFIRM): POSITIVE — AB

## 2014-12-18 LAB — RPR: RPR: NONREACTIVE

## 2014-12-18 NOTE — ED Notes (Signed)
Chart review.

## 2014-12-19 LAB — URINE CULTURE

## 2014-12-21 NOTE — ED Notes (Signed)
Patient returned call, and after verifying ID discussed positive findings. Advised to complete Rx as written, and to have her partner treated as well, since she could be reinfected by unprotected sex w that person. Patient verbalized understanding pf action plan

## 2014-12-21 NOTE — ED Notes (Signed)
Lab  review. Final report negative for GC and chlamydia, but positive for gardnerella and trichomonas. Treatment adequate w Rx flagyl. Called and left message for patient to call back to discuss lab reports

## 2015-03-11 DIAGNOSIS — E1169 Type 2 diabetes mellitus with other specified complication: Secondary | ICD-10-CM | POA: Insufficient documentation

## 2015-03-11 DIAGNOSIS — E669 Obesity, unspecified: Secondary | ICD-10-CM

## 2015-03-16 ENCOUNTER — Other Ambulatory Visit: Payer: Self-pay

## 2015-03-16 ENCOUNTER — Other Ambulatory Visit: Payer: Self-pay | Admitting: Surgical Oncology

## 2015-03-18 ENCOUNTER — Other Ambulatory Visit: Payer: Managed Care, Other (non HMO)

## 2015-03-22 ENCOUNTER — Ambulatory Visit
Admission: RE | Admit: 2015-03-22 | Discharge: 2015-03-22 | Disposition: A | Discharge status: Home / Self Care | Payer: Managed Care, Other (non HMO) | Source: Ambulatory Visit | Attending: Surgical Oncology | Admitting: Surgical Oncology

## 2015-03-23 ENCOUNTER — Emergency Department (HOSPITAL_COMMUNITY)
Admission: EM | Admit: 2015-03-23 | Discharge: 2015-03-23 | Disposition: A | Discharge status: Home / Self Care | Payer: Managed Care, Other (non HMO) | Attending: Emergency Medicine | Admitting: Emergency Medicine

## 2015-03-23 ENCOUNTER — Encounter (HOSPITAL_COMMUNITY): Payer: Self-pay | Admitting: Emergency Medicine

## 2015-03-23 DIAGNOSIS — E119 Type 2 diabetes mellitus without complications: Secondary | ICD-10-CM | POA: Diagnosis not present

## 2015-03-23 DIAGNOSIS — E669 Obesity, unspecified: Secondary | ICD-10-CM | POA: Diagnosis not present

## 2015-03-23 DIAGNOSIS — J45909 Unspecified asthma, uncomplicated: Secondary | ICD-10-CM | POA: Diagnosis not present

## 2015-03-23 DIAGNOSIS — Z880 Allergy status to penicillin: Secondary | ICD-10-CM | POA: Insufficient documentation

## 2015-03-23 DIAGNOSIS — Z79899 Other long term (current) drug therapy: Secondary | ICD-10-CM | POA: Diagnosis not present

## 2015-03-23 DIAGNOSIS — Z7952 Long term (current) use of systemic steroids: Secondary | ICD-10-CM | POA: Insufficient documentation

## 2015-03-23 DIAGNOSIS — L0231 Cutaneous abscess of buttock: Secondary | ICD-10-CM | POA: Diagnosis not present

## 2015-03-23 MED ORDER — NAPROXEN 500 MG PO TABS: 500.0000 mg | ORAL_TABLET | Freq: Two times a day (BID) | ORAL | Status: DC

## 2015-03-23 MED ORDER — SULFAMETHOXAZOLE-TRIMETHOPRIM 800-160 MG PO TABS
1.0000 | ORAL_TABLET | Freq: Once | ORAL | Status: AC
  Administered 2015-03-23: 1 via ORAL
  Filled 2015-03-23: qty 1

## 2015-03-23 MED ORDER — SULFAMETHOXAZOLE-TRIMETHOPRIM 800-160 MG PO TABS: 1.0000 | ORAL_TABLET | Freq: Two times a day (BID) | ORAL | Status: AC

## 2015-03-23 MED ORDER — LIDOCAINE HCL (PF) 1 % IJ SOLN
5.0000 mL | Freq: Once | INTRAMUSCULAR | Status: AC
  Administered 2015-03-23: 5 mL
  Filled 2015-03-23: qty 5

## 2015-03-23 NOTE — ED Notes (Signed)
Pt. reports multiple small skin abscesses at bilateral buttocks and right shin onset last week  with drainage .

## 2015-03-23 NOTE — Discharge Instructions (Signed)
Take the medication as directed and return in 2 days for packing removal and recheck. Continue to apply warm wet compresses to the other areas.   Abscess An abscess (boil or furuncle) is an infected area on or under the skin. This area is filled with yellowish-white fluid (pus) and other material (debris). HOME CARE   Only take medicines as told by your doctor.  If you were given antibiotic medicine, take it as directed. Finish the medicine even if you start to feel better.  If gauze is used, follow your doctor's directions for changing the gauze.  To avoid spreading the infection:  Keep your abscess covered with a bandage.  Wash your hands well.  Do not share personal care items, towels, or whirlpools with others.  Avoid skin contact with others.  Keep your skin and clothes clean around the abscess.  Keep all doctor visits as told. GET HELP RIGHT AWAY IF:   You have more pain, puffiness (swelling), or redness in the wound site.  You have more fluid or blood coming from the wound site.  You have muscle aches, chills, or you feel sick.  You have a fever. MAKE SURE YOU:   Understand these instructions.  Will watch your condition.  Will get help right away if you are not doing well or get worse. Document Released: 11/29/2007 Document Revised: 12/12/2011 Document Reviewed: 08/25/2011 [facility] Patient Information 2015 [place], [place]. This information is not intended to replace advice given to you by your health care provider. Make sure you discuss any questions you have with your health care provider.

## 2015-03-23 NOTE — ED Provider Notes (Signed)
CSN: [id number]     Arrival date & time 03/23/15  1947 History  By signing my name below, I, Marie Spears, attest that this documentation has been prepared under the direction and in the presence of Debroah Baller, NP.  Electronically Signed: Tula Spears, ED Scribe. 03/23/2015. 9:12 PM.   Chief Complaint  Patient presents with  . Abscess   The history is provided by the patient. No language interpreter was used.   HPI Comments: Marie Spears is a 39 y.o. female with a history of asthma and DM who presents to the Emergency Department complaining of multiple, gradually worsening areas of redness and swelling on her left buttocks, right hip and right shin that appeared 3 days ago. She states drainage from the areas and subjective fever as associated symptoms. Pt has not tried any treatment PTA. She denies chills.  Past Medical History  Diagnosis Date  . Asthma   . Diabetes mellitus without complication   . Obesity   . Bronchitis    Past Surgical History  Procedure Laterality Date  . Hernia repair     No family history on file. Social History  Substance Use Topics  . Smoking status: Never Smoker   . Smokeless tobacco: None  . Alcohol Use: No   OB History    No data available     Review of Systems  Constitutional: Positive for fever. Negative for chills.  Skin: Positive for wound.  All other systems reviewed and are negative.     Allergies  Penicillins  Home Medications   Prior to Admission medications   Medication Sig Start Date End Date Taking? Authorizing Aubriegh Minch  albuterol (PROVENTIL HFA;VENTOLIN HFA) 108 (90 BASE) MCG/ACT inhaler Inhale 2 puffs into the lungs every 6 (six) hours as needed for wheezing or shortness of breath.    Historical Ethlyn Alto, MD  chlorpheniramine-HYDROcodone (TUSSIONEX PENNKINETIC ER) 10-8 MG/5ML LQCR Take 5 mLs by mouth every 12 (twelve) hours as needed for cough. 09/29/14   Debby Freiberg, MD  gabapentin (NEURONTIN) 300 MG capsule  Take 300 mg by mouth as needed.    Historical Kamea Dacosta, MD  GLIPIZIDE PO Take by mouth.    Historical Miking Usrey, MD  HYDROcodone-acetaminophen (NORCO/VICODIN) 5-325 MG per tablet Take 1-2 tablets by mouth every 6 (six) hours as needed. 08/18/14   Montine Circle, PA-C  lisinopril (PRINIVIL,ZESTRIL) 5 MG tablet Take 5 mg by mouth daily.    Historical Keerthana Vanrossum, MD  loratadine (CLARITIN) 10 MG tablet Take 1 tablet (10 mg total) by mouth daily. 09/26/13   Tiffany Carlota Raspberry, PA-C  metFORMIN (GLUCOPHAGE) 1000 MG tablet Take 1,000 mg by mouth daily.    Historical Jaskarn Schweer, MD  metroNIDAZOLE (FLAGYL) 500 MG tablet Take 1 tablet (500 mg total) by mouth 2 (two) times daily. 12/17/14   Waldemar Dickens, MD  naproxen (NAPROSYN) 500 MG tablet Take 1 tablet (500 mg total) by mouth 2 (two) times daily. 03/23/15   Hope Bunnie Pion, NP  predniSONE (DELTASONE) 20 MG tablet Take 3 tablets (60 mg total) by mouth daily. 09/29/14   Debby Freiberg, MD  sulfamethoxazole-trimethoprim (BACTRIM DS,SEPTRA DS) 800-160 MG tablet Take 1 tablet by mouth 2 (two) times daily. 03/23/15 03/30/15  Hope Bunnie Pion, NP   BP 152/84 mmHg  Pulse 103  Temp(Src) 99.1 F (37.3 C) (Oral)  Resp 18  Ht 6' (1.829 m)  Wt 360 lb (163.295 kg)  BMI 48.81 kg/m2  SpO2 97%  LMP 03/13/2015 Physical Exam  Constitutional: She is oriented to person,  place, and time. She appears well-developed and well-nourished.  HENT:  Head: Normocephalic.  Eyes: EOM are normal.  Neck: Neck supple.  Cardiovascular: Normal rate.   Pulmonary/Chest: Effort normal.  Musculoskeletal: Normal range of motion.  Neurological: She is alert and oriented to person, place, and time. No cranial nerve deficit.  Skin: Skin is warm and dry.  Left buttocks: 3 cm area that is tender with slightly fluctuant center that has had some draining prior Just below ischial spine on the right: area that is tender and has scabbing. Pt has reports that it has drained. Right lower leg: Small blistered area  with erythema surrounding it, no red streaking  Psychiatric: She has a normal mood and affect. Her behavior is normal.  Nursing note and vitals reviewed.   ED Course  Procedures   DIAGNOSTIC STUDIES: Oxygen Saturation is 97% on RA, adequate by my interpretation.    COORDINATION OF CARE: 9:13 PM Discussed treatment plan with pt which includes I&D of the abscess of her left buttocks. Will prescribe antibiotics. Advised pt to use warm soaks. She agreed to plan.  INCISION AND DRAINAGE PROCEDURE NOTE: Patient identification was confirmed and verbal consent was obtained. This procedure was performed by Debroah Baller, NP at 9:49 PM. Site: Left buttocks Sterile procedures observed Needle size: 22 guage Anesthetic used (type and amt): 4 mL of 1% Lidocaine Blade size: 11 Drainage: Bloody, purulent Complexity: Complex Packing used. Site anesthetized, incision made over site, wound drained and explored loculations, rinsed with copious amounts of normal saline, wound packed with sterile gauze, covered with dry, sterile dressing.  Pt tolerated procedure well without complications.  Instructions for care discussed verbally and pt provided with additional written instructions for homecare and f/u.   MDM  39 y.o. female with pain and swelling of the left buttock that started last week and one area starting on her right side and right lower leg. Stable for d/c without fever and does not appear toxic. She will return in 2 days for packing removal and wound check. Will start antibiotics and she will take ibuprofen for pain.  Final diagnoses:  Abscess of left buttock   I personally performed the services described in this documentation, which was scribed in my presence. The recorded information has been reviewed and is accurate.    [name], NP 03/23/15 [place], DO 03/23/15 2328

## 2015-03-23 NOTE — ED Notes (Signed)
Patient left at this time with all belongings. Verbally acknowledges discharge instructions.

## 2015-03-23 NOTE — ED Notes (Signed)
Dressed wound with 2x2 Sterile Gauze and tape.

## 2015-03-25 ENCOUNTER — Encounter (HOSPITAL_COMMUNITY): Payer: Self-pay | Admitting: Emergency Medicine

## 2015-03-25 ENCOUNTER — Emergency Department (HOSPITAL_COMMUNITY)
Admission: EM | Admit: 2015-03-25 | Discharge: 2015-03-25 | Disposition: A | Discharge status: Home / Self Care | Payer: Managed Care, Other (non HMO) | Attending: Physician Assistant | Admitting: Physician Assistant

## 2015-03-25 DIAGNOSIS — E119 Type 2 diabetes mellitus without complications: Secondary | ICD-10-CM | POA: Diagnosis not present

## 2015-03-25 DIAGNOSIS — Z79899 Other long term (current) drug therapy: Secondary | ICD-10-CM | POA: Diagnosis not present

## 2015-03-25 DIAGNOSIS — J45909 Unspecified asthma, uncomplicated: Secondary | ICD-10-CM | POA: Insufficient documentation

## 2015-03-25 DIAGNOSIS — Z4801 Encounter for change or removal of surgical wound dressing: Secondary | ICD-10-CM | POA: Insufficient documentation

## 2015-03-25 DIAGNOSIS — Z792 Long term (current) use of antibiotics: Secondary | ICD-10-CM | POA: Insufficient documentation

## 2015-03-25 DIAGNOSIS — Z5189 Encounter for other specified aftercare: Secondary | ICD-10-CM

## 2015-03-25 DIAGNOSIS — Z880 Allergy status to penicillin: Secondary | ICD-10-CM | POA: Insufficient documentation

## 2015-03-25 DIAGNOSIS — E669 Obesity, unspecified: Secondary | ICD-10-CM | POA: Diagnosis not present

## 2015-03-25 NOTE — ED Provider Notes (Signed)
CSN: [id number]     Arrival date & time 03/25/15  1549 History  By signing my name below, I, Hansel Feinstein, attest that this documentation has been prepared under the direction and in the presence of Etta Quill, NP.  Electronically Signed: Hansel Feinstein, ED Scribe. 03/25/2015. 4:21 PM.    Chief Complaint  Patient presents with  . Wound Check   Patient is a 39 y.o. female presenting with wound check. The history is provided by the patient. No language interpreter was used.  Wound Check This is a new problem. The current episode started more than 2 days ago. The problem occurs constantly. The problem has been gradually improving. Exacerbated by: palpation. Relieved by: antibiotics. Treatments tried: antibiotics. The treatment provided moderate relief.    HPI Comments: Jerry Haugen is a 39 y.o. female with hx of asthma, DM, obesity who presents to the Emergency Department returning for wound check and packing removal of an abscess to the left buttock that was drained and packed 2 days ago in the ED. She states she has been taking the antibiotics as prescribed. Pt notes associated improving pain to the area. Pt notes that pain is worsened with palpation. PCP is Dr. Christie Beckers. She notes that she saw her PCP today and he did not check the wound. Pt takes Metformin for DM and reports that it is not well controlled due to her diet. She denies recent fevers or chills since treatment in the ED.   Past Medical History  Diagnosis Date  . Asthma   . Diabetes mellitus without complication   . Obesity   . Bronchitis    Past Surgical History  Procedure Laterality Date  . Hernia repair     History reviewed. No pertinent family history. Social History  Substance Use Topics  . Smoking status: Never Smoker   . Smokeless tobacco: None  . Alcohol Use: No   OB History    No data available     Review of Systems  Constitutional: Negative for fever and chills.  Skin: Positive for wound ( healing  abscess to the left buttock).  All other systems reviewed and are negative.  Allergies  Penicillins  Home Medications   Prior to Admission medications   Medication Sig Start Date End Date Taking? Authorizing Provider  albuterol (PROVENTIL HFA;VENTOLIN HFA) 108 (90 BASE) MCG/ACT inhaler Inhale 2 puffs into the lungs every 6 (six) hours as needed for wheezing or shortness of breath.    Historical Provider, MD  chlorpheniramine-HYDROcodone (TUSSIONEX PENNKINETIC ER) 10-8 MG/5ML LQCR Take 5 mLs by mouth every 12 (twelve) hours as needed for cough. 09/29/14   Debby Freiberg, MD  gabapentin (NEURONTIN) 300 MG capsule Take 300 mg by mouth as needed.    Historical Provider, MD  GLIPIZIDE PO Take by mouth.    Historical Provider, MD  HYDROcodone-acetaminophen (NORCO/VICODIN) 5-325 MG per tablet Take 1-2 tablets by mouth every 6 (six) hours as needed. 08/18/14   Montine Circle, PA-C  lisinopril (PRINIVIL,ZESTRIL) 5 MG tablet Take 5 mg by mouth daily.    Historical Provider, MD  loratadine (CLARITIN) 10 MG tablet Take 1 tablet (10 mg total) by mouth daily. 09/26/13   Tiffany Carlota Raspberry, PA-C  metFORMIN (GLUCOPHAGE) 1000 MG tablet Take 1,000 mg by mouth daily.    Historical Provider, MD  metroNIDAZOLE (FLAGYL) 500 MG tablet Take 1 tablet (500 mg total) by mouth 2 (two) times daily. 12/17/14   Waldemar Dickens, MD  naproxen (NAPROSYN) 500 MG tablet Take  1 tablet (500 mg total) by mouth 2 (two) times daily. 03/23/15   Hope Bunnie Pion, NP  predniSONE (DELTASONE) 20 MG tablet Take 3 tablets (60 mg total) by mouth daily. 09/29/14   Debby Freiberg, MD  sulfamethoxazole-trimethoprim (BACTRIM DS,SEPTRA DS) 800-160 MG tablet Take 1 tablet by mouth 2 (two) times daily. 03/23/15 03/30/15  Hope Bunnie Pion, NP   BP 156/83 mmHg  Pulse 83  Temp(Src) 98.2 F (36.8 C) (Oral)  Resp 18  SpO2 97%  LMP 03/13/2015 Physical Exam  Constitutional: She is oriented to person, place, and time. She appears well-developed and well-nourished.   HENT:  Head: Normocephalic and atraumatic.  Eyes: Conjunctivae and EOM are normal. Pupils are equal, round, and reactive to light.  Neck: Normal range of motion. Neck supple.  Cardiovascular: Normal rate, regular rhythm and normal heart sounds.   Pulmonary/Chest: Effort normal and breath sounds normal. No respiratory distress.  Lungs CTA bilaterally.   Abdominal: She exhibits no distension.  Musculoskeletal: Normal range of motion.  Neurological: She is alert and oriented to person, place, and time.  Skin: Skin is warm and dry.  Psychiatric: She has a normal mood and affect. Her behavior is normal.  Nursing note and vitals reviewed.   ED Course  Procedures (including critical care time) DIAGNOSTIC STUDIES: Oxygen Saturation is 97% on RA, normal by my interpretation.    COORDINATION OF CARE: 4:19 PM Discussed treatment plan with pt at bedside and pt agreed to plan.  Labs Review Labs Reviewed - No data to display  Imaging Review No results found. I have personally reviewed and evaluated these images and lab results as part of my medical decision-making.   EKG Interpretation None     Patient with I&D of left buttock on 03/23/15. Returns today for packing removal. Area of induration persists, but pain and erythema have improved. No reported fever/chills since night of initial treatment. Packing removed and dressing applied.  Patient has just started with a new PCP, and will follow-up with him as needed. MDM   Final diagnoses:  Wound check, abscess     I personally performed the services described in this documentation, which was scribed in my presence. The recorded information has been reviewed and is accurate.   Etta Quill, NP 03/25/15 1710  Courteney Julio Alm, MD 03/26/15 1654

## 2015-03-25 NOTE — Discharge Instructions (Signed)

## 2015-03-25 NOTE — ED Notes (Signed)
Pt here for wound recheck after having abscess on back packed 2 days ago

## 2015-12-06 ENCOUNTER — Emergency Department (HOSPITAL_COMMUNITY): Payer: Self-pay

## 2015-12-06 ENCOUNTER — Encounter (HOSPITAL_COMMUNITY): Payer: Self-pay | Admitting: *Deleted

## 2015-12-06 DIAGNOSIS — M25512 Pain in left shoulder: Secondary | ICD-10-CM | POA: Insufficient documentation

## 2015-12-06 DIAGNOSIS — M79672 Pain in left foot: Secondary | ICD-10-CM | POA: Insufficient documentation

## 2015-12-06 DIAGNOSIS — Z7984 Long term (current) use of oral hypoglycemic drugs: Secondary | ICD-10-CM | POA: Insufficient documentation

## 2015-12-06 DIAGNOSIS — E1165 Type 2 diabetes mellitus with hyperglycemia: Secondary | ICD-10-CM | POA: Insufficient documentation

## 2015-12-06 DIAGNOSIS — Z79899 Other long term (current) drug therapy: Secondary | ICD-10-CM | POA: Insufficient documentation

## 2015-12-06 DIAGNOSIS — M79671 Pain in right foot: Secondary | ICD-10-CM | POA: Insufficient documentation

## 2015-12-06 DIAGNOSIS — J45909 Unspecified asthma, uncomplicated: Secondary | ICD-10-CM | POA: Insufficient documentation

## 2015-12-06 LAB — CBC
HEMATOCRIT: 37.1 % (ref 36.0–46.0)
Hemoglobin: 11.9 g/dL — ABNORMAL LOW (ref 12.0–15.0)
MCH: 24.8 pg — AB (ref 26.0–34.0)
MCHC: 32.1 g/dL (ref 30.0–36.0)
MCV: 77.5 fL — AB (ref 78.0–100.0)
PLATELETS: 253 10*3/uL (ref 150–400)
RBC: 4.79 MIL/uL (ref 3.87–5.11)
RDW: 14.2 % (ref 11.5–15.5)
WBC: 7.5 10*3/uL (ref 4.0–10.5)

## 2015-12-06 LAB — BASIC METABOLIC PANEL
Anion gap: 6 (ref 5–15)
BUN: 13 mg/dL (ref 6–20)
CHLORIDE: 105 mmol/L (ref 101–111)
CO2: 24 mmol/L (ref 22–32)
CREATININE: 1.03 mg/dL — AB (ref 0.44–1.00)
Calcium: 9.3 mg/dL (ref 8.9–10.3)
GFR calc non Af Amer: >60 mL/min (ref >60)
Glucose, Bld: 246 mg/dL — ABNORMAL HIGH (ref 65–99)
POTASSIUM: 3.7 mmol/L (ref 3.5–5.1)
SODIUM: 135 mmol/L (ref 135–145)

## 2015-12-06 LAB — I-STAT TROPONIN, ED: Troponin i, poc: 0 ng/mL (ref 0.00–0.08)

## 2015-12-06 NOTE — ED Notes (Signed)
Pt reports shortness of breath, pain in both legs, headache, and left arm pain for two weeks. Pt states she has taken Advil and tylenol without relief.

## 2015-12-07 ENCOUNTER — Emergency Department (HOSPITAL_COMMUNITY)
Admission: EM | Admit: 2015-12-07 | Discharge: 2015-12-07 | Disposition: A | Discharge status: Home / Self Care | Payer: Self-pay | Attending: Emergency Medicine | Admitting: Emergency Medicine

## 2015-12-07 DIAGNOSIS — M79671 Pain in right foot: Secondary | ICD-10-CM

## 2015-12-07 DIAGNOSIS — M79672 Pain in left foot: Secondary | ICD-10-CM

## 2015-12-07 DIAGNOSIS — R739 Hyperglycemia, unspecified: Secondary | ICD-10-CM

## 2015-12-07 DIAGNOSIS — M25512 Pain in left shoulder: Secondary | ICD-10-CM

## 2015-12-07 LAB — D-DIMER, QUANTITATIVE (NOT AT ARMC)

## 2015-12-07 MED ORDER — METHOCARBAMOL 500 MG PO TABS: 1000.0000 mg | ORAL_TABLET | Freq: Four times a day (QID) | ORAL | Status: DC

## 2015-12-07 MED ORDER — METHOCARBAMOL 500 MG PO TABS
1000.0000 mg | ORAL_TABLET | Freq: Once | ORAL | Status: AC
  Administered 2015-12-07: 1000 mg via ORAL

## 2015-12-07 NOTE — ED Notes (Signed)
Patient left at this time with all belongings, escorted to lobby via wheelchair. Instructed not to drive for 4-6 hours d/t robaxin administration. Pt verbalized understanding, called ride in presence of RN.

## 2015-12-07 NOTE — Discharge Instructions (Signed)
Please read and follow all provided instructions.  Your diagnoses today include:  1. Foot pain, bilateral   2. Left shoulder pain   3. Hyperglycemia     Tests performed today include:  Blood counts and electrolytes - high blood sugar  Test for blood clot - negative  Test for heart attack - negative  Vital signs. See below for your results today.   Medications prescribed:   Robaxin (methocarbamol) - muscle relaxer medication  DO NOT drive or perform any activities that require you to be awake and alert because this medicine can make you drowsy.   Take any prescribed medications only as directed.  Home care instructions:  Follow any educational materials contained in this packet.  Follow-up instructions: Please follow-up with your primary care provider in the next 3 days for further evaluation of your symptoms.   Return instructions:   Please return to the Emergency Department if you experience worsening symptoms.   Please return if you have any other emergent concerns.  Additional Information:  Your vital signs today were: BP 141/85 mmHg   Pulse 73   Temp(Src) 98.3 F (36.8 C) (Oral)   Resp 19   SpO2 97%   LMP 11/12/2015 If your blood pressure (BP) was elevated above 135/85 this visit, please have this repeated by your doctor within one month. --------------

## 2015-12-07 NOTE — ED Provider Notes (Signed)
CSN: [id number]     Arrival date & time 12/06/15  2114 History   First MD Initiated Contact with Patient 12/07/15 0246     Chief Complaint  Patient presents with  . Shortness of Breath  . Generalized Body Aches    (Consider location/radiation/quality/duration/timing/severity/associated sxs/prior Treatment) HPI Comments: Patient with history of diabetes, "superficial blood clot in my leg" -- presents with complaint of lower extremity pain starting in her feet and radiating up her legs, left arm pain, and shortness of breath for the past 2 weeks. Patient states that certain activities such as walking her dog has caused her to become much more short of breath than usual. No chest pain. Patient's main concern is that she has a blood clot in her legs that spread to her lungs. No fevers, nausea, vomiting, or diarrhea. No abdominal pain. Patient endorses mild bilateral lower extremity swelling. Over-the-counter medicines have not been helping her symptoms. The onset of this condition was acute. The course is constant. Aggravating factors: none. Alleviating factors: none.    The history is provided by the patient.    Past Medical History  Diagnosis Date  . Asthma   . Diabetes mellitus without complication ([place])   . Obesity   . Bronchitis    Past Surgical History  Procedure Laterality Date  . Hernia repair     History reviewed. No pertinent family history. Social History  Substance Use Topics  . Smoking status: Never Smoker   . Smokeless tobacco: None  . Alcohol Use: No   OB History    No data available     Review of Systems  Constitutional: Negative for fever.  HENT: Negative for rhinorrhea and sore throat.   Eyes: Negative for redness.  Respiratory: Positive for shortness of breath. Negative for cough.   Cardiovascular: Positive for leg swelling. Negative for chest pain.  Gastrointestinal: Negative for nausea, vomiting, abdominal pain and diarrhea.  Genitourinary: Negative for  dysuria.  Musculoskeletal: Positive for myalgias.  Skin: Negative for rash.  Neurological: Negative for weakness and headaches.    Allergies  Penicillins  Home Medications   Prior to Admission medications   Medication Sig Start Date End Date Taking? Authorizing Provider  albuterol (PROVENTIL HFA;VENTOLIN HFA) 108 (90 BASE) MCG/ACT inhaler Inhale 2 puffs into the lungs every 6 (six) hours as needed for wheezing or shortness of breath.    Historical Provider, MD  chlorpheniramine-HYDROcodone (TUSSIONEX PENNKINETIC ER) 10-8 MG/5ML LQCR Take 5 mLs by mouth every 12 (twelve) hours as needed for cough. 09/29/14   Debby Freiberg, MD  gabapentin (NEURONTIN) 300 MG capsule Take 300 mg by mouth as needed.    Historical Provider, MD  GLIPIZIDE PO Take by mouth.    Historical Provider, MD  HYDROcodone-acetaminophen (NORCO/VICODIN) 5-325 MG per tablet Take 1-2 tablets by mouth every 6 (six) hours as needed. 08/18/14   Montine Circle, PA-C  lisinopril (PRINIVIL,ZESTRIL) 5 MG tablet Take 5 mg by mouth daily.    Historical Provider, MD  loratadine (CLARITIN) 10 MG tablet Take 1 tablet (10 mg total) by mouth daily. 09/26/13   Tiffany Carlota Raspberry, PA-C  metFORMIN (GLUCOPHAGE) 1000 MG tablet Take 1,000 mg by mouth daily.    Historical Provider, MD  metroNIDAZOLE (FLAGYL) 500 MG tablet Take 1 tablet (500 mg total) by mouth 2 (two) times daily. 12/17/14   Waldemar Dickens, MD  naproxen (NAPROSYN) 500 MG tablet Take 1 tablet (500 mg total) by mouth 2 (two) times daily. 03/23/15   [place],  NP  predniSONE (DELTASONE) 20 MG tablet Take 3 tablets (60 mg total) by mouth daily. 09/29/14   Debby Freiberg, MD   BP 145/73 mmHg  Pulse 78  Temp(Src) 98.3 F (36.8 C) (Oral)  Resp 18  SpO2 98%  LMP 11/12/2015   Physical Exam  Constitutional: She appears well-developed and well-nourished.  HENT:  Head: Normocephalic and atraumatic.  Mouth/Throat: Oropharynx is clear and moist.  Eyes: Conjunctivae are normal. Right eye  exhibits no discharge. Left eye exhibits no discharge.  Neck: Normal range of motion. Neck supple.  Cardiovascular: Normal rate, regular rhythm and normal heart sounds.   No murmur heard. Pulmonary/Chest: Effort normal and breath sounds normal. No respiratory distress. She has no wheezes. She has no rales.  Abdominal: Soft. There is no tenderness. There is no rebound and no guarding.  Musculoskeletal: She exhibits edema and tenderness.  Patient with bilateral pain on the soles of her feet with palpation. No swelling. No gross extremity edema. No redness or warmth of the lower extremities. Mild tenderness of the calves. L upper extremity exam normal currently.   Neurological: She is alert.  Skin: Skin is warm and dry. No erythema.  Psychiatric: She has a normal mood and affect.  Nursing note and vitals reviewed.   ED Course  Procedures (including critical care time) Labs Review Labs Reviewed  BASIC METABOLIC PANEL - Abnormal; Notable for the following:    Glucose, Bld 246 (*)    Creatinine, Ser 1.03 (*)    All other components within normal limits  CBC - Abnormal; Notable for the following:    Hemoglobin 11.9 (*)    MCV 77.5 (*)    MCH 24.8 (*)    All other components within normal limits  D-DIMER, QUANTITATIVE (NOT AT [facility])  Randolm Idol, ED    Imaging Review Dg Chest 2 View  12/06/2015  CLINICAL DATA:  Left-sided chest pain and shortness of Breath EXAM: CHEST  2 VIEW COMPARISON:  09/28/2014 FINDINGS: The heart size and mediastinal contours are within normal limits. Both lungs are clear. The visualized skeletal structures are unremarkable. IMPRESSION: No active cardiopulmonary disease. Electronically Signed   By: Inez Catalina M.D.   On: 12/06/2015 21:46   I have personally reviewed and evaluated these images and lab results as part of my medical decision-making.  ED ECG REPORT   Date: 12/07/2015  Rate: 95  Rhythm: normal sinus rhythm  QRS Axis: normal  Intervals:  normal  ST/T Wave abnormalities: normal  Conduction Disutrbances:none  Narrative Interpretation:   Old EKG Reviewed: unchanged from 2008  I have personally reviewed the EKG tracing and agree with the computerized printout as noted.   3:01 AM Patient seen and examined. Reviewed lab results next results. Patient is worried that she has a blood clot causing her symptoms. D-dimer ordered. This test discussed with patient the need to proceed with CT if elevated.   Vital signs reviewed and are as follows: BP 145/73 mmHg  Pulse 78  Temp(Src) 98.3 F (36.8 C) (Oral)  Resp 18  SpO2 98%  LMP 11/12/2015  4:13 AM D-dimer is negative. Patient informed. Will give trial of robaxin. Encouraged heat on sore areas as well. F/u with PCP in 1-2 weeks if symptoms not improved.   Patient counseled on proper use of muscle relaxant medication.  They were told not to drink alcohol, drive any vehicle, or do any dangerous activities while taking this medication.  Patient verbalized understanding.  Patient urged to return with worsening  symptoms or other concerns. Patient verbalized understanding and agrees with plan.    MDM   Final diagnoses:  Foot pain, bilateral  Left shoulder pain  Hyperglycemia   MSK pain: Seems muscular in nature rather than radicular. No gross swelling or skin signs of infection. D-dimer is negative. EKG and troponin are reassuring.  Hyperglycemia: Mild, no ketosis. Continue home meds.    Carlisle Cater, PA-C 12/07/15 [place], DO 12/07/15 [phone]

## 2015-12-07 NOTE — ED Notes (Signed)
Pt requesting pain medication p/t to discharge, states she will get a ride. PA notified. See new orders.

## 2016-02-24 ENCOUNTER — Encounter (HOSPITAL_COMMUNITY): Payer: Self-pay | Admitting: Emergency Medicine

## 2016-02-24 ENCOUNTER — Emergency Department (HOSPITAL_COMMUNITY)
Admission: EM | Admit: 2016-02-24 | Discharge: 2016-02-24 | Disposition: A | Discharge status: Home / Self Care | Payer: Managed Care, Other (non HMO) | Attending: Emergency Medicine | Admitting: Emergency Medicine

## 2016-02-24 DIAGNOSIS — M79661 Pain in right lower leg: Secondary | ICD-10-CM | POA: Insufficient documentation

## 2016-02-24 DIAGNOSIS — Z7984 Long term (current) use of oral hypoglycemic drugs: Secondary | ICD-10-CM | POA: Insufficient documentation

## 2016-02-24 DIAGNOSIS — M79604 Pain in right leg: Secondary | ICD-10-CM

## 2016-02-24 DIAGNOSIS — Z79899 Other long term (current) drug therapy: Secondary | ICD-10-CM | POA: Insufficient documentation

## 2016-02-24 DIAGNOSIS — M79662 Pain in left lower leg: Secondary | ICD-10-CM | POA: Insufficient documentation

## 2016-02-24 DIAGNOSIS — E119 Type 2 diabetes mellitus without complications: Secondary | ICD-10-CM | POA: Insufficient documentation

## 2016-02-24 DIAGNOSIS — J45909 Unspecified asthma, uncomplicated: Secondary | ICD-10-CM | POA: Insufficient documentation

## 2016-02-24 DIAGNOSIS — M79605 Pain in left leg: Secondary | ICD-10-CM

## 2016-02-24 DIAGNOSIS — R51 Headache: Secondary | ICD-10-CM | POA: Insufficient documentation

## 2016-02-24 LAB — CBC WITH DIFFERENTIAL/PLATELET
BASOS ABS: 0 10*3/uL (ref 0.0–0.1)
Basophils Relative: 0 %
Eosinophils Absolute: 0.1 10*3/uL (ref 0.0–0.7)
Eosinophils Relative: 2 %
HEMATOCRIT: 37.6 % (ref 36.0–46.0)
HEMOGLOBIN: 12.3 g/dL (ref 12.0–15.0)
LYMPHS ABS: 2.8 10*3/uL (ref 0.7–4.0)
LYMPHS PCT: 48 %
MCH: 25.7 pg — ABNORMAL LOW (ref 26.0–34.0)
MCHC: 32.7 g/dL (ref 30.0–36.0)
MCV: 78.5 fL (ref 78.0–100.0)
Monocytes Absolute: 0.4 10*3/uL (ref 0.1–1.0)
Monocytes Relative: 7 %
NEUTROS ABS: 2.5 10*3/uL (ref 1.7–7.7)
Neutrophils Relative %: 43 %
Platelets: 218 10*3/uL (ref 150–400)
RBC: 4.79 MIL/uL (ref 3.87–5.11)
RDW: 14.6 % (ref 11.5–15.5)
WBC: 5.7 10*3/uL (ref 4.0–10.5)

## 2016-02-24 LAB — COMPREHENSIVE METABOLIC PANEL
ALK PHOS: 79 U/L (ref 38–126)
ALT: 29 U/L (ref 14–54)
AST: 33 U/L (ref 15–41)
Albumin: 3.7 g/dL (ref 3.5–5.0)
Anion gap: 8 (ref 5–15)
BILIRUBIN TOTAL: 0.5 mg/dL (ref 0.3–1.2)
BUN: 9 mg/dL (ref 6–20)
CALCIUM: 9.2 mg/dL (ref 8.9–10.3)
CHLORIDE: 105 mmol/L (ref 101–111)
CO2: 24 mmol/L (ref 22–32)
CREATININE: 0.88 mg/dL (ref 0.44–1.00)
GFR calc Af Amer: >60 mL/min (ref >60)
Glucose, Bld: 195 mg/dL — ABNORMAL HIGH (ref 65–99)
Potassium: 3.7 mmol/L (ref 3.5–5.1)
Sodium: 137 mmol/L (ref 135–145)
TOTAL PROTEIN: 7.1 g/dL (ref 6.5–8.1)

## 2016-02-24 LAB — URINALYSIS, ROUTINE W REFLEX MICROSCOPIC
BILIRUBIN URINE: NEGATIVE
Glucose, UA: 500 mg/dL — AB
Ketones, ur: NEGATIVE mg/dL
Nitrite: NEGATIVE
Protein, ur: NEGATIVE mg/dL
SPECIFIC GRAVITY, URINE: 1.034 — AB (ref 1.005–1.030)
pH: 5 (ref 5.0–8.0)

## 2016-02-24 LAB — URINE MICROSCOPIC-ADD ON

## 2016-02-24 LAB — D-DIMER, QUANTITATIVE (NOT AT ARMC): D DIMER QUANT: 0.42 ug{FEU}/mL (ref 0.00–0.50)

## 2016-02-24 MED ORDER — HYDROCODONE-ACETAMINOPHEN 5-325 MG PO TABS
1.0000 | ORAL_TABLET | Freq: Once | ORAL | Status: AC
  Administered 2016-02-24: 1 via ORAL
  Filled 2016-02-24: qty 1

## 2016-02-24 MED ORDER — HYDROCODONE-ACETAMINOPHEN 5-325 MG PO TABS: 1.0000 | ORAL_TABLET | Freq: Four times a day (QID) | ORAL | 0 refills | Status: DC | PRN

## 2016-02-24 MED ORDER — METFORMIN HCL 1000 MG PO TABS: 2000.0000 mg | ORAL_TABLET | Freq: Two times a day (BID) | ORAL | 0 refills | Status: DC

## 2016-02-24 MED ORDER — KETOROLAC TROMETHAMINE 30 MG/ML IJ SOLN
15.0000 mg | Freq: Once | INTRAMUSCULAR | Status: AC
  Administered 2016-02-24: 15 mg via INTRAVENOUS
  Filled 2016-02-24: qty 1

## 2016-02-24 NOTE — ED Triage Notes (Signed)
Pt also c/o headache.  ?

## 2016-02-24 NOTE — Discharge Instructions (Signed)
As discussed, your evaluation today has been largely reassuring.  But, it is important that you monitor your condition carefully, and do not hesitate to return to the ED if you develop new, or concerning changes in your condition. ? ?Otherwise, please follow-up with your physician for appropriate ongoing care. ? ?

## 2016-02-24 NOTE — ED Provider Notes (Signed)
[facility] DEPT Provider Note   CSN: [id number] Arrival date & time: 02/24/16  1702     History   Chief Complaint Chief Complaint  Patient presents with  . Leg Pain  . Headache    HPI Marie Spears is a 40 y.o. female.  HPI Patient presents with concern of ongoing bilateral leg pain, swelling, headache. Patient notes symptoms probably began a few months ago, but have become worse the past few days. Symptoms are symmetric, with bilateral lower extremity pain, swelling, diffuse soreness with minimal activity. No new incontinence, abdominal pain, fever, chills. No new medication taken, no new activity, diet. Patient states that she has actually lost weight, though she appears more swollen in both lower extremities. Patient acknowledges a history of DVT in the distant past.  Past Medical History:  Diagnosis Date  . Asthma   . Bronchitis   . Diabetes mellitus without complication ([place])   . Obesity     There are no active problems to display for this patient.   Past Surgical History:  Procedure Laterality Date  . HERNIA REPAIR      OB History    No data available       Home Medications    Prior to Admission medications   Medication Sig Start Date End Date Taking? Authorizing Provider  albuterol (PROVENTIL HFA;VENTOLIN HFA) 108 (90 BASE) MCG/ACT inhaler Inhale 2 puffs into the lungs every 6 (six) hours as needed for wheezing or shortness of breath.    Historical Provider, MD  glipiZIDE (GLUCOTROL) 5 MG tablet Take 5 mg by mouth 2 (two) times daily before a meal.    Historical Provider, MD  metFORMIN (GLUCOPHAGE) 1000 MG tablet Take 2,000 mg by mouth 2 (two) times daily.     Historical Provider, MD  methocarbamol (ROBAXIN) 500 MG tablet Take 2 tablets (1,000 mg total) by mouth 4 (four) times daily. 12/07/15   Carlisle Cater, PA-C    Family History No family history on file.  Social History Social History  Substance Use Topics  . Smoking status:  Never Smoker  . Smokeless tobacco: Not on file  . Alcohol use No     Allergies   Penicillins   Review of Systems Review of Systems  Constitutional:       Per HPI, otherwise negative  HENT:       Per HPI, otherwise negative  Respiratory:       Per HPI, otherwise negative  Cardiovascular:       Per HPI, otherwise negative  Gastrointestinal: Negative for vomiting.  Endocrine:       Negative aside from HPI  Genitourinary:       Neg aside from HPI   Musculoskeletal:       Per HPI, otherwise negative  Skin: Negative.   Neurological: Negative for syncope.     Physical Exam Updated Vital Signs BP 132/90 (BP Location: Left Arm)   Pulse 72   Temp 97.8 F (36.6 C) (Oral)   Resp 20   LMP 02/18/2016   SpO2 99%   Physical Exam  Constitutional: She is oriented to person, place, and time. She appears well-developed and well-nourished. No distress.  Obese young female sitting upright, speaking clearly  HENT:  Head: Normocephalic and atraumatic.  Eyes: Conjunctivae and EOM are normal.  Cardiovascular: Normal rate and regular rhythm.   Pulmonary/Chest: Effort normal and breath sounds normal. No stridor. No respiratory distress.  Abdominal: She exhibits no distension.  Musculoskeletal: She exhibits no edema.  Both lower extremities are large, though symmetric, with no pitting. Patient moves each hip, knee, ankle spontaneously, without limits secondary to pain. No appreciable deformities.   Neurological: She is alert and oriented to person, place, and time. No cranial nerve deficit.  Skin: Skin is warm and dry.  Psychiatric: She has a normal mood and affect.  Nursing note and vitals reviewed.    ED Treatments / Results  Labs (all labs ordered are listed, but only abnormal results are displayed) Labs Reviewed  CBC WITH DIFFERENTIAL/PLATELET - Abnormal; Notable for the following:       Result Value   MCH 25.7 (*)    All other components within normal limits    COMPREHENSIVE METABOLIC PANEL - Abnormal; Notable for the following:    Glucose, Bld 195 (*)    All other components within normal limits  URINALYSIS, ROUTINE W REFLEX MICROSCOPIC (NOT AT [facility]) - Abnormal; Notable for the following:    APPearance HAZY (*)    Specific Gravity, Urine 1.034 (*)    Glucose, UA 500 (*)    Hgb urine dipstick MODERATE (*)    Leukocytes, UA SMALL (*)    All other components within normal limits  URINE MICROSCOPIC-ADD ON - Abnormal; Notable for the following:    Squamous Epithelial / LPF 0-5 (*)    Bacteria, UA FEW (*)    All other components within normal limits  D-DIMER, QUANTITATIVE (NOT AT [facility])  BRAIN NATRIURETIC PEPTIDE     Procedures Procedures (including critical care time)  Medications Ordered in ED Medications - No data to display   Initial Impression / Assessment and Plan / ED Course  I have reviewed the triage vital signs and the nursing notes.  Pertinent labs & imaging results that were available during my care of the patient were reviewed by me and considered in my medical decision making (see chart for details).  Clinical Course   10:39 PM On repeat exam the patient is in no distress. We discussed all findings again at length, including no evidence for CHF exacerbation, pulmonary embolism, renal failure, hepatic failure. We discussed importance of ongoing management as an outpatient, including consideration of endocrinology (Thyroid studies?), primary care and orthopedics.  Patient will follow-up with primary care specific to her diabetes, concern for poor control, given the presence of substantial glucose in her urine.  Final Clinical Impressions(s) / ED Diagnoses  Patient presents with concern of ongoing bilateral leg pain. Notably, the patient has large, has symmetric lower extremities are edematous. Here the evaluation is inconsistent with hepatic or renal failure, DVT, cellulitis. Symptoms maybe secondary to chronic venous  insufficiency versus radiculopathy with neuropathy. With these considerations, patient was discharged in stable condition with analgesia to follow-up with outpatient providers.    Carmin Muskrat, MD 02/24/16 2242

## 2016-02-24 NOTE — ED Triage Notes (Signed)
Pt c/o bilateral leg pain that shoots down her legs. Pain been going on for months. Pt states shes been seen here before for the same thing.

## 2016-07-20 DIAGNOSIS — R7303 Prediabetes: Secondary | ICD-10-CM | POA: Insufficient documentation

## 2016-07-20 DIAGNOSIS — L732 Hidradenitis suppurativa: Secondary | ICD-10-CM | POA: Insufficient documentation

## 2016-07-21 DIAGNOSIS — E785 Hyperlipidemia, unspecified: Secondary | ICD-10-CM | POA: Insufficient documentation

## 2016-07-21 DIAGNOSIS — E119 Type 2 diabetes mellitus without complications: Secondary | ICD-10-CM | POA: Insufficient documentation

## 2016-09-25 DIAGNOSIS — Z7984 Long term (current) use of oral hypoglycemic drugs: Secondary | ICD-10-CM | POA: Insufficient documentation

## 2016-09-25 DIAGNOSIS — E1165 Type 2 diabetes mellitus with hyperglycemia: Secondary | ICD-10-CM | POA: Diagnosis not present

## 2016-09-25 DIAGNOSIS — R51 Headache: Secondary | ICD-10-CM | POA: Diagnosis present

## 2016-09-25 DIAGNOSIS — E1140 Type 2 diabetes mellitus with diabetic neuropathy, unspecified: Secondary | ICD-10-CM | POA: Insufficient documentation

## 2016-09-25 DIAGNOSIS — J45909 Unspecified asthma, uncomplicated: Secondary | ICD-10-CM | POA: Insufficient documentation

## 2016-09-25 DIAGNOSIS — R0789 Other chest pain: Secondary | ICD-10-CM | POA: Insufficient documentation

## 2016-09-25 DIAGNOSIS — G43009 Migraine without aura, not intractable, without status migrainosus: Secondary | ICD-10-CM | POA: Diagnosis not present

## 2016-09-26 ENCOUNTER — Encounter (HOSPITAL_COMMUNITY): Payer: Self-pay | Admitting: Emergency Medicine

## 2016-09-26 ENCOUNTER — Emergency Department (HOSPITAL_COMMUNITY)
Admission: EM | Admit: 2016-09-26 | Discharge: 2016-09-26 | Disposition: A | Discharge status: Home / Self Care | Payer: BC Managed Care – PPO | Attending: Emergency Medicine | Admitting: Emergency Medicine

## 2016-09-26 ENCOUNTER — Emergency Department (HOSPITAL_COMMUNITY): Payer: BC Managed Care – PPO

## 2016-09-26 DIAGNOSIS — R0789 Other chest pain: Secondary | ICD-10-CM

## 2016-09-26 DIAGNOSIS — G629 Polyneuropathy, unspecified: Secondary | ICD-10-CM

## 2016-09-26 DIAGNOSIS — R739 Hyperglycemia, unspecified: Secondary | ICD-10-CM

## 2016-09-26 DIAGNOSIS — G43009 Migraine without aura, not intractable, without status migrainosus: Secondary | ICD-10-CM

## 2016-09-26 LAB — BASIC METABOLIC PANEL
Anion gap: 11 (ref 5–15)
BUN: 13 mg/dL (ref 6–20)
CO2: 24 mmol/L (ref 22–32)
Calcium: 9.3 mg/dL (ref 8.9–10.3)
Chloride: 102 mmol/L (ref 101–111)
Creatinine, Ser: 0.92 mg/dL (ref 0.44–1.00)
Glucose, Bld: 276 mg/dL — ABNORMAL HIGH (ref 65–99)
POTASSIUM: 3.9 mmol/L (ref 3.5–5.1)
SODIUM: 137 mmol/L (ref 135–145)

## 2016-09-26 LAB — I-STAT TROPONIN, ED
TROPONIN I, POC: 0 ng/mL (ref 0.00–0.08)
Troponin i, poc: 0 ng/mL (ref 0.00–0.08)

## 2016-09-26 LAB — CBC
HEMATOCRIT: 38.1 % (ref 36.0–46.0)
Hemoglobin: 12.9 g/dL (ref 12.0–15.0)
MCH: 26.9 pg (ref 26.0–34.0)
MCHC: 33.9 g/dL (ref 30.0–36.0)
MCV: 79.5 fL (ref 78.0–100.0)
PLATELETS: 251 10*3/uL (ref 150–400)
RBC: 4.79 MIL/uL (ref 3.87–5.11)
RDW: 14.6 % (ref 11.5–15.5)
WBC: 7.4 10*3/uL (ref 4.0–10.5)

## 2016-09-26 LAB — I-STAT BETA HCG BLOOD, ED (MC, WL, AP ONLY): I-stat hCG, quantitative: <5 m[IU]/mL (ref <5)

## 2016-09-26 LAB — D-DIMER, QUANTITATIVE: D-Dimer, Quant: 0.31 ug/mL-FEU (ref 0.00–0.50)

## 2016-09-26 MED ORDER — SODIUM CHLORIDE 0.9 % IV BOLUS (SEPSIS)
1000.0000 mL | Freq: Once | INTRAVENOUS | Status: AC
  Administered 2016-09-26: 1000 mL via INTRAVENOUS

## 2016-09-26 MED ORDER — KETOROLAC TROMETHAMINE 30 MG/ML IJ SOLN
30.0000 mg | Freq: Once | INTRAMUSCULAR | Status: AC
  Administered 2016-09-26: 30 mg via INTRAVENOUS
  Filled 2016-09-26: qty 1

## 2016-09-26 MED ORDER — DIPHENHYDRAMINE HCL 50 MG/ML IJ SOLN
50.0000 mg | Freq: Once | INTRAMUSCULAR | Status: AC
  Administered 2016-09-26: 50 mg via INTRAVENOUS
  Filled 2016-09-26: qty 1

## 2016-09-26 MED ORDER — METOCLOPRAMIDE HCL 5 MG/ML IJ SOLN
10.0000 mg | Freq: Once | INTRAMUSCULAR | Status: AC
Start: 2016-09-26 — End: 2016-09-26
  Administered 2016-09-26: 10 mg via INTRAVENOUS
  Filled 2016-09-26: qty 2

## 2016-09-26 NOTE — ED Provider Notes (Signed)
TIME SEEN: 4:47 AM  CHIEF COMPLAINT: Multiple complaints  HPI: Patient is a 41 year old female who has a history of non-insulin-dependent diabetes, obesity, asthma, migraine headaches who presents emergency department with multiple complaints. First patient complains of left sided dull chest pain that is mild to moderate in nature without radiation that has been intermittent for the past 1-2 months. Does have intermittent shortness of breath but none currently. No aggravating or relieving factors. Pain is not exertional or pleuritic. She has had nausea but none today. Has a family history of a father with an MI in his 64s. Does have history of DVT in 2004 because she was on a birth control patch. Is no longer on anticoagulation. No history of hypertension, hyperlipidemia, tobacco use. She denies history of stress test or cardiac catheterization. Not having chest pain currently. No diaphoresis, dizziness with this pain.   Patient also complaining of diffuse throbbing headache that is typical of her previous migraines. Has also been intermittent for the past 1-2 months. Has associated photophobia. No head injury. Has not taken any medications for her headache at home. No vomiting. No focal weakness. No sudden onset, worst headache of her life.   Patient also complaining of several months of tingling in bilateral toes worse on the left side. No other focal numbness or weakness. No back pain. States her blood sugar has not been well controlled and was in the 300s earlier today.   ROS: See HPI Constitutional: no fever  Eyes: no drainage  ENT: no runny nose   Cardiovascular:   chest pain  Resp: no SOB  GI: no vomiting GU: no dysuria Integumentary: no rash  Allergy: no hives  Musculoskeletal: no leg swelling  Neurological: no slurred speech ROS otherwise negative  PAST MEDICAL HISTORY/PAST SURGICAL HISTORY:  Past Medical History:  Diagnosis Date  . Asthma   . Bronchitis   . Diabetes mellitus  without complication ([place])   . Obesity     MEDICATIONS:  Prior to Admission medications   Medication Sig Start Date End Date Taking? Authorizing Provider  albuterol (PROVENTIL HFA;VENTOLIN HFA) 108 (90 BASE) MCG/ACT inhaler Inhale 2 puffs into the lungs every 6 (six) hours as needed for wheezing or shortness of breath.    Historical Provider, MD  glipiZIDE (GLUCOTROL) 5 MG tablet Take 5 mg by mouth 2 (two) times daily before a meal.    Historical Provider, MD  HYDROcodone-acetaminophen (NORCO/VICODIN) 5-325 MG tablet Take 1 tablet by mouth every 6 (six) hours as needed for severe pain. 02/24/16   Carmin Muskrat, MD  metFORMIN (GLUCOPHAGE) 1000 MG tablet Take 2 tablets (2,000 mg total) by mouth 2 (two) times daily. 02/24/16   Carmin Muskrat, MD    ALLERGIES:  Allergies  Allergen Reactions  . Penicillins     Itching Has patient had a PCN reaction causing immediate rash, facial/tongue/throat swelling, SOB or lightheadedness with hypotension:YES Has patient had a PCN reaction causing severe rash involving mucus membranes or skin necrosis: NO Has patient had a PCN reaction that required hospitalization NO Has patient had a PCN reaction occurring within the last 10 years: NO If all of the above answers are "NO", then may proceed with Cephalosporin use.      SOCIAL HISTORY:  Social History  Substance Use Topics  . Smoking status: Never Smoker  . Smokeless tobacco: Never Used  . Alcohol use No    FAMILY HISTORY: No family history on file.  EXAM: BP (!) 163/98 (BP Location: Left Arm)  Pulse 79   Temp 98.5 F (36.9 C) (Oral)   Resp 16   Ht 6' (1.829 m)   Wt (!) 342 lb (155.1 kg)   LMP 09/02/2016   SpO2 96%   BMI 46.38 kg/m  CONSTITUTIONAL: Alert and oriented and responds appropriately to questions. Well-appearing; well-nourished, obese, in no distress HEAD: Normocephalic EYES: Conjunctivae clear, pupils appear equal, EOMI, photophobia ENT: normal nose; moist mucous  membranes NECK: Supple, no meningismus, no nuchal rigidity, no LAD  CARD: RRR; S1 and S2 appreciated; no murmurs, no clicks, no rubs, no gallops RESP: Normal chest excursion without splinting or tachypnea; breath sounds clear and equal bilaterally; no wheezes, no rhonchi, no rales, no hypoxia or respiratory distress, speaking full sentences ABD/GI: Normal bowel sounds; non-distended; soft, non-tender, no rebound, no guarding, no peritoneal signs, no hepatosplenomegaly BACK:  The back appears normal and is non-tender to palpation, there is no CVA tenderness EXT: Normal ROM in all joints; non-tender to palpation; no edema; normal capillary refill; no cyanosis, no calf tenderness or swelling    SKIN: Normal color for age and race; warm; no rash NEURO: Moves all extremities equally; Strength 5/5 in all four extremities.  Normal sensation diffusely.  CN 2-12 grossly intact.  No dysmetria to finger to nose testing bilaterally.  Normal speech.  Normal gait. PSYCH: The patient's mood and manner are appropriate. Grooming and personal hygiene are appropriate.  MEDICAL DECISION MAKING: Patient here with multiple complaints. Chest pain seems very atypical but she does have risk factors for ACS. Her heart score is 2.  First troponin negative. Will repeat a second troponin and a d-dimer. Chest x-ray is clear. No chest pain currently. Doubt ACS, PE or dissection. Recommended close outpatient PCP follow-up.  Patient also complaining of migraine headache typical of her previous migraines. Doubt intracranial hemorrhage, stroke, meningitis. We'll give migraine cocktail including Toradol, Reglan, Benadryl, IV fluids and reassess.  Patient also complaining of bilateral tingling in her toes. No other focal neurologic deficit. Suspect this is neuropathy. Blood sugar is 276 today without signs of DKA. We'll give IV fluids. Have recommended close follow up with her primary care physician and strict management of her  hyperglycemia.  ED PROGRESS: Patient's second troponin is negative. D-dimer negative. She is not pregnant. Migraine headache is completely resolved. Still no chest pain. I feel she is safe to be discharged home with close outpatient follow-up. Discussed return precautions.  At this time, I do not feel there is any life-threatening condition present. I have reviewed and discussed all results (EKG, imaging, lab, urine as appropriate) and exam findings with patient/family. I have reviewed nursing notes and appropriate previous records.  I feel the patient is safe to be discharged home without further emergent workup and can continue workup as an outpatient as needed. Discussed usual and customary return precautions. Patient/family verbalize understanding and are comfortable with this plan.  Outpatient follow-up has been provided if needed. All questions have been answered.      EKG Interpretation  Date/Time:  Tuesday September 26 2016 00:01:09 EDT Ventricular Rate:  84 PR Interval:  152 QRS Duration: 88 QT Interval:  380 QTC Calculation: 449 R Axis:   6 Text Interpretation:  Normal sinus rhythm Normal ECG No significant change since last tracing Confirmed by WARD,  DO, KRISTEN [phone]) on 09/26/2016 4:47:25 AM     ]    [place], DO 09/26/16 [phone]

## 2016-09-26 NOTE — ED Notes (Signed)
Pt verbalized understanding of d/c instructions and has no further questions. Pt is stable, A&Ox4, VSS.  

## 2016-09-26 NOTE — ED Notes (Signed)
Nurse drawing labs. 

## 2016-09-26 NOTE — Discharge Instructions (Signed)
To find a primary care or specialty doctor please call [phone] or [phone] to access "[facility] Find a Doctor Service." ° °You may also go on the [facility] website at [email or web address] ° °There are also multiple [facility], [facility] practices throughout the [place] that are frequently accepting new patients. You may find a clinic that is close to your home and contact them. ° °[facility] -  °[address] °[place] [address] °[phone] ° ° °[facility] -  °[address] °[place] [address] [phone] ° ° °[facility] - °[address]  °[place] [address] °[phone] ° ° °

## 2016-09-26 NOTE — ED Notes (Signed)
Family up to nurse first asking for updates regarding wait times, provided answers to questions and apologized for wait times.

## 2016-09-26 NOTE — ED Triage Notes (Signed)
Patient reports left chest pain radiating to left upper arm onset today with mild SOB , no nausea or diaphoresis .

## 2016-12-12 ENCOUNTER — Encounter: Payer: Managed Care, Other (non HMO) | Admitting: Vascular Surgery

## 2017-10-24 DIAGNOSIS — H25043 Posterior subcapsular polar age-related cataract, bilateral: Secondary | ICD-10-CM | POA: Insufficient documentation

## 2017-10-24 DIAGNOSIS — H25031 Anterior subcapsular polar age-related cataract, right eye: Secondary | ICD-10-CM | POA: Insufficient documentation

## 2017-10-24 DIAGNOSIS — IMO0002 Reserved for concepts with insufficient information to code with codable children: Secondary | ICD-10-CM | POA: Insufficient documentation

## 2017-10-24 DIAGNOSIS — H2512 Age-related nuclear cataract, left eye: Secondary | ICD-10-CM | POA: Insufficient documentation

## 2017-10-27 ENCOUNTER — Encounter (HOSPITAL_BASED_OUTPATIENT_CLINIC_OR_DEPARTMENT_OTHER): Payer: Self-pay | Admitting: *Deleted

## 2017-10-27 ENCOUNTER — Emergency Department (HOSPITAL_BASED_OUTPATIENT_CLINIC_OR_DEPARTMENT_OTHER)
Admission: EM | Admit: 2017-10-27 | Discharge: 2017-10-28 | Disposition: A | Discharge status: Home / Self Care | Payer: BC Managed Care – PPO | Attending: Emergency Medicine | Admitting: Emergency Medicine

## 2017-10-27 ENCOUNTER — Other Ambulatory Visit: Payer: Self-pay

## 2017-10-27 DIAGNOSIS — J4521 Mild intermittent asthma with (acute) exacerbation: Secondary | ICD-10-CM | POA: Insufficient documentation

## 2017-10-27 DIAGNOSIS — R0602 Shortness of breath: Secondary | ICD-10-CM | POA: Insufficient documentation

## 2017-10-27 DIAGNOSIS — Z7982 Long term (current) use of aspirin: Secondary | ICD-10-CM | POA: Diagnosis not present

## 2017-10-27 DIAGNOSIS — E119 Type 2 diabetes mellitus without complications: Secondary | ICD-10-CM | POA: Insufficient documentation

## 2017-10-27 DIAGNOSIS — R05 Cough: Secondary | ICD-10-CM | POA: Diagnosis not present

## 2017-10-27 DIAGNOSIS — Z79899 Other long term (current) drug therapy: Secondary | ICD-10-CM | POA: Insufficient documentation

## 2017-10-27 MED ORDER — IPRATROPIUM BROMIDE 0.02 % IN SOLN
RESPIRATORY_TRACT | Status: AC
  Administered 2017-10-27: 0.5 mg
  Filled 2017-10-27: qty 2.5

## 2017-10-27 MED ORDER — ALBUTEROL SULFATE (2.5 MG/3ML) 0.083% IN NEBU
INHALATION_SOLUTION | RESPIRATORY_TRACT | Status: AC
  Administered 2017-10-27: 5 mg
  Filled 2017-10-27: qty 6

## 2017-10-27 NOTE — ED Triage Notes (Signed)
Pt has hx of asthma. States allergies have been "acting up". Non-prod cough. States having trouble sleeping due to cough. Assessed by RT in triage.

## 2017-10-28 MED ORDER — ALBUTEROL SULFATE HFA 108 (90 BASE) MCG/ACT IN AERS: 2.0000 | INHALATION_SPRAY | RESPIRATORY_TRACT | 0 refills | Status: DC | PRN

## 2017-10-28 MED ORDER — ALBUTEROL SULFATE HFA 108 (90 BASE) MCG/ACT IN AERS
2.0000 | INHALATION_SPRAY | Freq: Once | RESPIRATORY_TRACT | Status: AC
  Administered 2017-10-28: 2 via RESPIRATORY_TRACT
  Filled 2017-10-28: qty 6.7

## 2017-10-28 MED ORDER — DEXAMETHASONE SODIUM PHOSPHATE 10 MG/ML IJ SOLN
10.0000 mg | Freq: Once | INTRAMUSCULAR | Status: AC
  Administered 2017-10-28: 10 mg via INTRAMUSCULAR
  Filled 2017-10-28: qty 1

## 2017-10-28 NOTE — ED Notes (Signed)
ED Provider at bedside. 

## 2017-10-28 NOTE — ED Notes (Signed)
Pt ambulated around dept on room air. SATS 97-100% HR 97-98. No distress noted.

## 2017-10-28 NOTE — ED Provider Notes (Signed)
[facility] EMERGENCY DEPARTMENT Provider Note   CSN: [id number] Arrival date & time: 10/27/17  2319     History   Chief Complaint Chief Complaint  Patient presents with  . Shortness of Breath    HPI Marie Spears is a 42 y.o. female.  HPI  This is a 42 year old female with a history of asthma, diabetes who presents with shortness of breath.  Patient reports 1 week history of worsening shortness of breath and cough.  She is out of her inhaler.  She does report allergy symptoms and has been taking an allergy medication.  This sometimes sets off her asthma.  She denies any chest pain.  She denies any fevers.  Cough is nonproductive.  She denies any other infection symptoms including congestion, sore throat, nausea, vomiting, diarrhea.  She was given a DuoNeb prior to my assessment and states she feels much better.  Past Medical History:  Diagnosis Date  . Asthma   . Bronchitis   . Diabetes mellitus without complication ([place])   . Obesity     There are no active problems to display for this patient.   Past Surgical History:  Procedure Laterality Date  . HERNIA REPAIR       OB History   None      Home Medications    Prior to Admission medications   Medication Sig Start Date End Date Taking? Authorizing Provider  albuterol (PROVENTIL HFA;VENTOLIN HFA) 108 (90 BASE) MCG/ACT inhaler Inhale 2 puffs into the lungs every 6 (six) hours as needed for wheezing or shortness of breath.    [provider]  albuterol (PROVENTIL HFA;VENTOLIN HFA) 108 (90 Base) MCG/ACT inhaler Inhale 2 puffs into the lungs every 4 (four) hours as needed for wheezing or shortness of breath. 10/28/17   Orby Tangen, Barbette Hair, MD  aspirin 325 MG tablet Take 325 mg by mouth every 6 (six) hours as needed for mild pain.    [provider]  HYDROcodone-acetaminophen (NORCO/VICODIN) 5-325 MG tablet Take 1 tablet by mouth every 6 (six) hours as needed for severe pain. 02/24/16    Carmin Muskrat, MD  ibuprofen (ADVIL,MOTRIN) 200 MG tablet Take 200-800 mg by mouth every 6 (six) hours as needed for moderate pain.    [provider]    Family History No family history on file.  Social History Social History   Tobacco Use  . Smoking status: Never Smoker  . Smokeless tobacco: Never Used  Substance Use Topics  . Alcohol use: No  . Drug use: No     Allergies   Penicillins   Review of Systems Review of Systems  Constitutional: Negative for fever.  HENT: Negative for congestion.   Respiratory: Positive for cough, shortness of breath and wheezing.   Cardiovascular: Negative for chest pain and leg swelling.  Gastrointestinal: Negative for nausea and vomiting.  All other systems reviewed and are negative.    Physical Exam Updated Vital Signs BP 126/60 (BP Location: Right Arm)   Pulse 87   Temp 98.7 F (37.1 C) (Oral)   Resp 20   Ht 6' (1.829 m)   Wt (!) 155.1 kg (342 lb)   LMP 10/15/2017   SpO2 100%   BMI 46.38 kg/m   Physical Exam  Constitutional: She is oriented to person, place, and time. She appears well-developed and well-nourished.  Obese, no acute distress  HENT:  Head: Normocephalic and atraumatic.  Eyes: Pupils are equal, round, and reactive to light.  Cardiovascular: Normal rate,  regular rhythm and normal heart sounds.  Pulmonary/Chest: Effort normal. No respiratory distress. She has no wheezes.  Abdominal: Soft. Bowel sounds are normal.  Musculoskeletal:       Right lower leg: She exhibits no edema.       Left lower leg: She exhibits no edema.  Neurological: She is alert and oriented to person, place, and time.  Skin: Skin is warm and dry.  Psychiatric: She has a normal mood and affect.  Nursing note and vitals reviewed.    ED Treatments / Results  Labs (all labs ordered are listed, but only abnormal results are displayed) Labs Reviewed - No data to display  EKG None  Radiology No results  found.  Procedures Procedures (including critical care time)  Medications Ordered in ED Medications  albuterol (PROVENTIL) (2.5 MG/3ML) 0.083% nebulizer solution (5 mg  Given 10/27/17 2345)  ipratropium (ATROVENT) 0.02 % nebulizer solution (0.5 mg  Given 10/27/17 2345)  albuterol (PROVENTIL HFA;VENTOLIN HFA) 108 (90 Base) MCG/ACT inhaler 2 puff (2 puffs Inhalation Given 10/28/17 0155)  dexamethasone (DECADRON) injection 10 mg (10 mg Intramuscular Given 10/28/17 0155)     Initial Impression / Assessment and Plan / ED Course  I have reviewed the triage vital signs and the nursing notes.  Pertinent labs & imaging results that were available during my care of the patient were reviewed by me and considered in my medical decision making (see chart for details).     Patient presents with shortness of breath.  Reports improvement after DuoNeb in triage.  She is overall nontoxic-appearing and vital signs are reassuring.  Satting 100% on room air.  No wheezing on my exam.  No respiratory distress.  Given improvement with DuoNeb, likely related to asthma.  No indication for imaging at this time.  Patient was given IM Decadron.  She ambulated and maintained her pulse ox without difficulty.  Patient was given an inhaler.  After history, exam, and medical workup I feel the patient has been appropriately medically screened and is safe for discharge home. Pertinent diagnoses were discussed with the patient. Patient was given return precautions.   Final Clinical Impressions(s) / ED Diagnoses   Final diagnoses:  Shortness of breath  Mild intermittent asthma with exacerbation    ED Discharge Orders        Ordered    albuterol (PROVENTIL HFA;VENTOLIN HFA) 108 (90 Base) MCG/ACT inhaler  Every 4 hours PRN     10/28/17 0248       Merryl Hacker, MD 10/28/17 [phone]

## 2017-11-29 DIAGNOSIS — Z9841 Cataract extraction status, right eye: Secondary | ICD-10-CM | POA: Insufficient documentation

## 2018-05-01 ENCOUNTER — Other Ambulatory Visit: Payer: Self-pay

## 2018-05-01 ENCOUNTER — Emergency Department (HOSPITAL_BASED_OUTPATIENT_CLINIC_OR_DEPARTMENT_OTHER)
Admission: EM | Admit: 2018-05-01 | Discharge: 2018-05-01 | Disposition: A | Discharge status: Home / Self Care | Payer: BC Managed Care – PPO | Attending: Emergency Medicine | Admitting: Emergency Medicine

## 2018-05-01 DIAGNOSIS — L02416 Cutaneous abscess of left lower limb: Secondary | ICD-10-CM | POA: Insufficient documentation

## 2018-05-01 DIAGNOSIS — J45909 Unspecified asthma, uncomplicated: Secondary | ICD-10-CM | POA: Insufficient documentation

## 2018-05-01 DIAGNOSIS — E119 Type 2 diabetes mellitus without complications: Secondary | ICD-10-CM | POA: Diagnosis not present

## 2018-05-01 DIAGNOSIS — L0291 Cutaneous abscess, unspecified: Secondary | ICD-10-CM | POA: Diagnosis present

## 2018-05-01 DIAGNOSIS — R6883 Chills (without fever): Secondary | ICD-10-CM | POA: Diagnosis not present

## 2018-05-01 MED ORDER — LIDOCAINE HCL (PF) 1 % IJ SOLN
5.0000 mL | Freq: Once | INTRAMUSCULAR | Status: AC
  Administered 2018-05-01: 5 mL
  Filled 2018-05-01: qty 5

## 2018-05-01 MED ORDER — SULFAMETHOXAZOLE-TRIMETHOPRIM 800-160 MG PO TABS
1.0000 | ORAL_TABLET | Freq: Once | ORAL | Status: AC
  Administered 2018-05-01: 1 via ORAL
  Filled 2018-05-01: qty 1

## 2018-05-01 MED ORDER — SULFAMETHOXAZOLE-TRIMETHOPRIM 800-160 MG PO TABS: 1.0000 | ORAL_TABLET | Freq: Two times a day (BID) | ORAL | 0 refills | Status: AC

## 2018-05-01 NOTE — ED Triage Notes (Signed)
Boil to left inner thigh/groin area.  Noticed it 3 days ago.  Has been using OTC but not helping.

## 2018-05-01 NOTE — Discharge Instructions (Addendum)
Warm compresses to area for 20 minutes at a time. Take antibiotics as prescribed and complete the full course. Wound check in 2 days for packing removal. Return to ER for any worsening or concerning symptoms.

## 2018-05-01 NOTE — ED Provider Notes (Signed)
[facility] EMERGENCY DEPARTMENT Provider Note   CSN: [id number] Arrival date & time: 05/01/18  1632     History   Chief Complaint Chief Complaint  Patient presents with  . Abscess    HPI Marie Spears is a 42 y.o. female.  42 year old female with history of insulin-dependent diabetes presents with abscess to her left inner thigh x3 to 4 days.  Patient has been applying OTC cream to the area without improvement.  Ports abscesses previously, not in this area.  Patient states that she did not feel well today, has had slight chills without fever.  No other complaints or concerns.     Past Medical History:  Diagnosis Date  . Asthma   . Bronchitis   . Diabetes mellitus without complication ([place])   . Obesity     There are no active problems to display for this patient.   Past Surgical History:  Procedure Laterality Date  . HERNIA REPAIR       OB History   None      Home Medications    Prior to Admission medications   Medication Sig Start Date End Date Taking? Authorizing Provider  albuterol (PROVENTIL HFA;VENTOLIN HFA) 108 (90 Base) MCG/ACT inhaler Inhale 2 puffs into the lungs every 4 (four) hours as needed for wheezing or shortness of breath. 10/28/17   [place], Barbette Hair, MD  insulin degludec (TRESIBA) 100 UNIT/ML SOPN FlexTouch Pen Inject into the skin.    [provider]  sulfamethoxazole-trimethoprim (BACTRIM DS,SEPTRA DS) 800-160 MG tablet Take 1 tablet by mouth 2 (two) times daily for 10 days. 05/01/18 05/11/18  Tacy Learn, PA-C    Family History No family history on file.  Social History Social History   Tobacco Use  . Smoking status: Never Smoker  . Smokeless tobacco: Never Used  Substance Use Topics  . Alcohol use: No  . Drug use: No     Allergies   Penicillins   Review of Systems Review of Systems  Constitutional: Positive for chills. Negative for fever.  Musculoskeletal: Negative for arthralgias and  myalgias.  Skin: Positive for color change. Negative for wound.  Allergic/Immunologic: Positive for immunocompromised state.  Neurological: Negative for weakness.  Hematological: Negative for adenopathy.  Psychiatric/Behavioral: Negative for confusion.  All other systems reviewed and are negative.    Physical Exam Updated Vital Signs BP 129/79 (BP Location: Right Arm)   Pulse 96   Temp 99.1 F (37.3 C) (Oral)   Resp 20   Ht 6' (1.829 m)   Wt (!) 159.2 kg   LMP 04/22/2018   SpO2 97%   BMI 47.60 kg/m   Physical Exam  Constitutional: She is oriented to person, place, and time. She appears well-developed and well-nourished. No distress.  HENT:  Head: Normocephalic and atraumatic.  Pulmonary/Chest: Effort normal.  Musculoskeletal: She exhibits tenderness. She exhibits no deformity.  Neurological: She is alert and oriented to person, place, and time. No sensory deficit.  Skin: Skin is warm and dry. She is not diaphoretic. There is erythema.     Psychiatric: She has a normal mood and affect. Her behavior is normal.  Nursing note and vitals reviewed.    ED Treatments / Results  Labs (all labs ordered are listed, but only abnormal results are displayed) Labs Reviewed - No data to display  EKG None  Radiology No results found.  Procedures .Marland KitchenIncision and Drainage Date/Time: 05/01/2018 6:01 PM Performed by: Tacy Learn, PA-C Authorized by: Tacy Learn,  PA-C   Consent:    Consent obtained:  Verbal   Consent given by:  Patient   Risks discussed:  Bleeding, incomplete drainage, pain and damage to other organs   Alternatives discussed:  No treatment Universal protocol:    Procedure explained and questions answered to patient or proxy's satisfaction: yes     Immediately prior to procedure a time out was called: yes     Patient identity confirmed:  Verbally with patient Location:    Type:  Abscess   Size:  6cm x 7cm   Location:  Lower extremity   Lower  extremity location:  Leg   Leg location:  L upper leg Pre-procedure details:    Skin preparation:  Betadine Anesthesia (see MAR for exact dosages):    Anesthesia method:  Local infiltration   Local anesthetic:  Lidocaine 1% w/o epi Procedure type:    Complexity:  Complex Procedure details:    Incision types:  Single straight   Incision depth:  Subcutaneous   Scalpel blade:  11   Wound management:  Probed and deloculated, irrigated with saline and extensive cleaning   Drainage:  Purulent   Drainage amount:  Moderate   Packing materials:  1/4 in gauze   Amount 1/4":  5" Post-procedure details:    Patient tolerance of procedure:  Tolerated well, no immediate complications   (including critical care time)  Medications Ordered in ED Medications  sulfamethoxazole-trimethoprim (BACTRIM DS,SEPTRA DS) 800-160 MG per tablet 1 tablet (has no administration in time range)  lidocaine (PF) (XYLOCAINE) 1 % injection 5 mL (5 mLs Infiltration Given by Other 05/01/18 1755)     Initial Impression / Assessment and Plan / ED Course  I have reviewed the triage vital signs and the nursing notes.  Pertinent labs & imaging results that were available during my care of the patient were reviewed by me and considered in my medical decision making (see chart for details).  Clinical Course as of May 01 1801  Wed May 01, 5633  592 42 year old insulin-dependent diabetic female with a left thigh abscess.  Area treated with I&D, packing placed.  Patient given first dose of Septra in the ER, discharged home with prescription for same.  Patient to follow-up with PCP in 2 days for packing removal and wound check, return to ER for any worsening or concerning symptoms.   [LM]    Clinical Course User Index [LM] Tacy Learn, PA-C   Final Clinical Impressions(s) / ED Diagnoses   Final diagnoses:  Abscess    ED Discharge Orders         Ordered    sulfamethoxazole-trimethoprim (BACTRIM DS,SEPTRA DS)  800-160 MG tablet  2 times daily     05/01/18 1757           Tacy Learn, PA-C 05/01/18 1802    Lennice Sites, DO 05/01/18 2310

## 2018-05-01 NOTE — ED Notes (Signed)
ED Provider at bedside. 

## 2018-06-09 ENCOUNTER — Encounter (HOSPITAL_BASED_OUTPATIENT_CLINIC_OR_DEPARTMENT_OTHER): Payer: Self-pay | Admitting: Emergency Medicine

## 2018-06-09 ENCOUNTER — Emergency Department (HOSPITAL_BASED_OUTPATIENT_CLINIC_OR_DEPARTMENT_OTHER)
Admission: EM | Admit: 2018-06-09 | Discharge: 2018-06-09 | Disposition: A | Discharge status: Home / Self Care | Payer: BC Managed Care – PPO | Attending: Emergency Medicine | Admitting: Emergency Medicine

## 2018-06-09 ENCOUNTER — Other Ambulatory Visit: Payer: Self-pay

## 2018-06-09 DIAGNOSIS — E1165 Type 2 diabetes mellitus with hyperglycemia: Secondary | ICD-10-CM | POA: Diagnosis not present

## 2018-06-09 DIAGNOSIS — J45909 Unspecified asthma, uncomplicated: Secondary | ICD-10-CM | POA: Diagnosis not present

## 2018-06-09 DIAGNOSIS — Z794 Long term (current) use of insulin: Secondary | ICD-10-CM | POA: Diagnosis not present

## 2018-06-09 DIAGNOSIS — M7989 Other specified soft tissue disorders: Secondary | ICD-10-CM

## 2018-06-09 DIAGNOSIS — R609 Edema, unspecified: Secondary | ICD-10-CM | POA: Diagnosis not present

## 2018-06-09 DIAGNOSIS — Z86718 Personal history of other venous thrombosis and embolism: Secondary | ICD-10-CM | POA: Diagnosis not present

## 2018-06-09 DIAGNOSIS — R739 Hyperglycemia, unspecified: Secondary | ICD-10-CM

## 2018-06-09 DIAGNOSIS — Z79899 Other long term (current) drug therapy: Secondary | ICD-10-CM | POA: Diagnosis not present

## 2018-06-09 HISTORY — DX: Acute embolism and thrombosis of unspecified deep veins of unspecified lower extremity: I82.409

## 2018-06-09 LAB — CBC WITH DIFFERENTIAL/PLATELET
ABS IMMATURE GRANULOCYTES: 0.01 10*3/uL (ref 0.00–0.07)
Basophils Absolute: 0 10*3/uL (ref 0.0–0.1)
Basophils Relative: 0 %
EOS ABS: 0.1 10*3/uL (ref 0.0–0.5)
Eosinophils Relative: 1 %
HEMATOCRIT: 39.8 % (ref 36.0–46.0)
Hemoglobin: 12.6 g/dL (ref 12.0–15.0)
IMMATURE GRANULOCYTES: 0 %
LYMPHS ABS: 2.5 10*3/uL (ref 0.7–4.0)
Lymphocytes Relative: 39 %
MCH: 25.6 pg — ABNORMAL LOW (ref 26.0–34.0)
MCHC: 31.7 g/dL (ref 30.0–36.0)
MCV: 80.9 fL (ref 80.0–100.0)
MONOS PCT: 8 %
Monocytes Absolute: 0.5 10*3/uL (ref 0.1–1.0)
NEUTROS ABS: 3.2 10*3/uL (ref 1.7–7.7)
NEUTROS PCT: 52 %
Platelets: 251 10*3/uL (ref 150–400)
RBC: 4.92 MIL/uL (ref 3.87–5.11)
RDW: 13.8 % (ref 11.5–15.5)
WBC: 6.3 10*3/uL (ref 4.0–10.5)
nRBC: 0 % (ref 0.0–0.2)

## 2018-06-09 LAB — BASIC METABOLIC PANEL
ANION GAP: 7 (ref 5–15)
BUN: 14 mg/dL (ref 6–20)
CHLORIDE: 102 mmol/L (ref 98–111)
CO2: 25 mmol/L (ref 22–32)
Calcium: 9 mg/dL (ref 8.9–10.3)
Creatinine, Ser: 0.93 mg/dL (ref 0.44–1.00)
GFR calc Af Amer: >60 mL/min (ref >60)
GFR calc non Af Amer: >60 mL/min (ref >60)
GLUCOSE: 330 mg/dL — AB (ref 70–99)
POTASSIUM: 4 mmol/L (ref 3.5–5.1)
Sodium: 134 mmol/L — ABNORMAL LOW (ref 135–145)

## 2018-06-09 LAB — D-DIMER, QUANTITATIVE: D-Dimer, Quant: 0.41 ug/mL-FEU (ref 0.00–0.50)

## 2018-06-09 LAB — TROPONIN I: Troponin I: <0.03 ng/mL (ref <0.03)

## 2018-06-09 MED ORDER — METFORMIN HCL 500 MG PO TABS: 500.0000 mg | ORAL_TABLET | Freq: Two times a day (BID) | ORAL | 0 refills | Status: DC

## 2018-06-09 MED ORDER — METFORMIN HCL 500 MG PO TABS
500.0000 mg | ORAL_TABLET | Freq: Once | ORAL | Status: AC
  Administered 2018-06-09: 500 mg via ORAL
  Filled 2018-06-09: qty 1

## 2018-06-09 MED ORDER — FUROSEMIDE 20 MG PO TABS: ORAL_TABLET | ORAL | 0 refills | Status: DC

## 2018-06-09 MED ORDER — FUROSEMIDE 40 MG PO TABS
40.0000 mg | ORAL_TABLET | Freq: Once | ORAL | Status: AC
  Administered 2018-06-09: 40 mg via ORAL
  Filled 2018-06-09: qty 1

## 2018-06-09 MED ORDER — INSULIN DEGLUDEC 100 UNIT/ML ~~LOC~~ SOPN: 10.0000 [IU] | PEN_INJECTOR | Freq: Every day | SUBCUTANEOUS | 0 refills | Status: DC

## 2018-06-09 NOTE — ED Triage Notes (Signed)
Patient states that she is having pain and swelling to her left ankle and up into her left calf  - she reports chest pain this am

## 2018-06-09 NOTE — ED Provider Notes (Addendum)
[facility] Provider Note: Georgena Spurling, MD, FACEP  CSN: [id number] MRN: [id number] ARRIVAL: 06/09/18 at [place]: [place]  Ankle Pain   HISTORY OF PRESENT ILLNESS  06/09/18 6:25 AM Marie Spears is a 42 y.o. female with diabetes and a remote history of deep vein thrombosis.  She is here with a one-week history of bilateral lower extremity swelling which began after excessive exercising.  She is also having pain in her left ankle radiating upward.  She rates her pain as an 8 out of 10 at its worst, worse with ambulation or movement.  She had some chest pain and shortness of breath earlier but none since being roomed in the ED.  She has a history of diabetes but has been noncompliant with her metformin and insulin.   Past Medical History:  Diagnosis Date  . Asthma   . Bronchitis   . Diabetes mellitus without complication ([place])   . DVT (deep venous thrombosis) ([place])   . Obesity     Past Surgical History:  Procedure Laterality Date  . HERNIA REPAIR      History reviewed. No pertinent family history.  Social History   Tobacco Use  . Smoking status: Never Smoker  . Smokeless tobacco: Never Used  Substance Use Topics  . Alcohol use: No  . Drug use: No    Prior to Admission medications   Medication Sig Start Date End Date Taking? Authorizing Provider  albuterol (PROVENTIL HFA;VENTOLIN HFA) 108 (90 Base) MCG/ACT inhaler Inhale 2 puffs into the lungs every 4 (four) hours as needed for wheezing or shortness of breath. 10/28/17   Horton, Barbette Hair, MD  insulin degludec (TRESIBA) 100 UNIT/ML SOPN FlexTouch Pen Inject into the skin.    [provider]    Allergies Penicillins   REVIEW OF SYSTEMS  Negative except as noted here or in the History of Present Illness.   PHYSICAL EXAMINATION  Initial Vital Signs Blood pressure (!) 145/85, pulse 73, temperature 98.7 F (37.1 C), temperature source Oral, resp. rate 19, height  6' (1.829 m), weight (!) 155.1 kg, last menstrual period 05/23/2018, SpO2 99 %.  Examination General: Well-developed, well-nourished female in no acute distress; appearance consistent with age of record HENT: normocephalic; atraumatic Eyes: pupils equal, round and reactive to light; extraocular muscles intact Neck: supple Heart: regular rate and rhythm Lungs: clear to auscultation bilaterally Abdomen: soft; nondistended; nontender; bowel sounds present Extremities: No deformity; full range of motion; pulses normal; +1 edema of lower legs; tenderness of left lower leg without erythema or warmth Neurologic: Awake, alert and oriented; motor function intact in all extremities and symmetric; no facial droop Skin: Warm and dry; facial hirsutism Psychiatric: Normal mood and affect   RESULTS  Summary of this visit's results, reviewed by myself:   EKG Interpretation  Date/Time:  Sunday June 09 2018 01:09:19 EST Ventricular Rate:  89 PR Interval:  158 QRS Duration: 86 QT Interval:  364 QTC Calculation: 442 R Axis:   11 Text Interpretation:  Normal sinus rhythm Cannot rule out Anterior infarct , age undetermined Abnormal ECG Confirmed by Shanon Rosser [phone]) on 06/09/2018 1:13:46 AM      Laboratory Studies: Results for orders placed or performed during the hospital encounter of 06/09/18 (from the past 24 hour(s))  D-dimer, quantitative (not at [facility])     Status: None   Collection Time: 06/09/18  1:18 AM  Result Value Ref Range   D-Dimer, Quant 0.41 0.00 - 0.50  ug/mL-FEU  CBC with Differential/Platelet     Status: Abnormal   Collection Time: 06/09/18  1:18 AM  Result Value Ref Range   WBC 6.3 4.0 - 10.5 K/uL   RBC 4.92 3.87 - 5.11 MIL/uL   Hemoglobin 12.6 12.0 - 15.0 g/dL   HCT 39.8 36.0 - 46.0 %   MCV 80.9 80.0 - 100.0 fL   MCH 25.6 (L) 26.0 - 34.0 pg   MCHC 31.7 30.0 - 36.0 g/dL   RDW 13.8 11.5 - 15.5 %   Platelets 251 150 - 400 K/uL   nRBC 0.0 0.0 - 0.2 %   Neutrophils  Relative % 52 %   Neutro Abs 3.2 1.7 - 7.7 K/uL   Lymphocytes Relative 39 %   Lymphs Abs 2.5 0.7 - 4.0 K/uL   Monocytes Relative 8 %   Monocytes Absolute 0.5 0.1 - 1.0 K/uL   Eosinophils Relative 1 %   Eosinophils Absolute 0.1 0.0 - 0.5 K/uL   Basophils Relative 0 %   Basophils Absolute 0.0 0.0 - 0.1 K/uL   Immature Granulocytes 0 %   Abs Immature Granulocytes 0.01 0.00 - 0.07 K/uL  Basic metabolic panel     Status: Abnormal   Collection Time: 06/09/18  1:18 AM  Result Value Ref Range   Sodium 134 (L) 135 - 145 mmol/L   Potassium 4.0 3.5 - 5.1 mmol/L   Chloride 102 98 - 111 mmol/L   CO2 25 22 - 32 mmol/L   Glucose, Bld 330 (H) 70 - 99 mg/dL   BUN 14 6 - 20 mg/dL   Creatinine, Ser 0.93 0.44 - 1.00 mg/dL   Calcium 9.0 8.9 - 10.3 mg/dL   GFR calc non Af Amer >60 >60 mL/min   GFR calc Af Amer >60 >60 mL/min   Anion gap 7 5 - 15  Troponin I - ONCE - STAT     Status: None   Collection Time: 06/09/18  1:18 AM  Result Value Ref Range   Troponin I <0.03 <0.03 ng/mL   Imaging Studies: No results found.  ED COURSE and MDM  Nursing notes and initial vitals signs, including pulse oximetry, reviewed.  Vitals:   06/09/18 0111 06/09/18 0439  BP: (!) 152/95 (!) 145/85  Pulse: 86 73  Resp: 18 19  Temp: 98.5 F (36.9 C) 98.7 F (37.1 C)  TempSrc: Oral Oral  SpO2: 100% 99%  Weight: (!) 155.1 kg   Height: 6' (1.829 m)    The patient's d-dimer is within normal limits.  Given her history we will schedule an outpatient Doppler.  Her glucose is elevated at 330 and as noted above she has not been taking her metformin.  We will restart her on metformin.    PROCEDURES    ED DIAGNOSES     ICD-10-CM   1. Peripheral edema R60.9   2. Type 2 diabetes mellitus with hyperglycemia, without long-term current use of insulin ([place]) E11.65        Shanon Rosser, MD 06/09/18 6553    Shanon Rosser, MD 06/09/18 [id number]

## 2018-06-10 ENCOUNTER — Ambulatory Visit (HOSPITAL_BASED_OUTPATIENT_CLINIC_OR_DEPARTMENT_OTHER)
Admission: RE | Admit: 2018-06-10 | Discharge: 2018-06-10 | Disposition: A | Discharge status: Home / Self Care | Payer: BC Managed Care – PPO | Source: Ambulatory Visit | Attending: Emergency Medicine | Admitting: Emergency Medicine

## 2018-06-10 DIAGNOSIS — M7989 Other specified soft tissue disorders: Secondary | ICD-10-CM | POA: Diagnosis present

## 2018-06-15 ENCOUNTER — Other Ambulatory Visit (HOSPITAL_BASED_OUTPATIENT_CLINIC_OR_DEPARTMENT_OTHER): Payer: BC Managed Care – PPO

## 2018-08-03 ENCOUNTER — Ambulatory Visit (INDEPENDENT_AMBULATORY_CARE_PROVIDER_SITE_OTHER): Payer: BC Managed Care – PPO

## 2018-08-03 ENCOUNTER — Encounter: Payer: Self-pay | Admitting: Podiatry

## 2018-08-03 ENCOUNTER — Ambulatory Visit: Payer: BC Managed Care – PPO | Admitting: Podiatry

## 2018-08-03 VITALS — BP 131/79 | HR 77 | Resp 16

## 2018-08-03 DIAGNOSIS — M659 Synovitis and tenosynovitis, unspecified: Secondary | ICD-10-CM | POA: Diagnosis not present

## 2018-08-03 DIAGNOSIS — M65172 Other infective (teno)synovitis, left ankle and foot: Secondary | ICD-10-CM

## 2018-08-03 DIAGNOSIS — J45909 Unspecified asthma, uncomplicated: Secondary | ICD-10-CM | POA: Insufficient documentation

## 2018-08-03 DIAGNOSIS — M65972 Unspecified synovitis and tenosynovitis, left ankle and foot: Secondary | ICD-10-CM

## 2018-08-03 MED ORDER — MELOXICAM 15 MG PO TABS: 15.0000 mg | ORAL_TABLET | Freq: Every day | ORAL | 1 refills | Status: AC

## 2018-08-08 NOTE — Progress Notes (Signed)
   Subjective:  43 year old female presenting today as a new patient with a chief complaint of an injury to the left ankle that occurred about two months ago. She states the injury occurred while standing on a vibration plate while exercising. She reports sudden onset aching pain to the medial and lateral aspects of the ankle. She reports associated constant swelling and discoloration. Bearing weight and touching the area increases the pain. She was evaluated in the ED for DVT which was negative. She has been elevating the ankle and taking Ibuprofen for pain. Patient is here for further evaluation and treatment.   Past Medical History:  Diagnosis Date  . Asthma   . Bronchitis   . Diabetes mellitus without complication ([place])   . DVT (deep venous thrombosis) ([place])   . Obesity      Objective / Physical Exam:  General:  The patient is alert and oriented x3 in no acute distress. Dermatology:  Skin is warm, dry and supple bilateral lower extremities. Negative for open lesions or macerations. Vascular:  Palpable pedal pulses bilaterally. No edema or erythema noted. Capillary refill within normal limits. Neurological:  Epicritic and protective threshold grossly intact bilaterally.  Musculoskeletal Exam:  Pain on palpation to the anterior lateral medial aspects of the patient's left ankle. Mild edema noted. Range of motion within normal limits to all pedal and ankle joints bilateral. Muscle strength 5/5 in all groups bilateral.   Radiographic Exam:  Normal osseous mineralization. Joint spaces preserved. No fracture/dislocation/boney destruction.    Assessment: 1. Left ankle synovitis   Plan of Care:  1. Patient was evaluated. X-Rays reviewed.  2. injection of 0.5 mL Celestone Soluspan injected in the patient's left ankle. 3. Prescription for Meloxicam provided to patient. 4. CAM boot dispensed.  5. Return to clinic in 4 weeks.   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    Edrick Kins, DPM [facility]  Dr. Edrick Kins, [address]                                        [place], [place] [address]                Office [phone]  Fax [phone]

## 2018-08-09 ENCOUNTER — Ambulatory Visit: Payer: BC Managed Care – PPO | Admitting: Podiatry

## 2018-08-12 ENCOUNTER — Ambulatory Visit: Payer: BC Managed Care – PPO | Admitting: Podiatry

## 2018-08-28 ENCOUNTER — Ambulatory Visit: Payer: BC Managed Care – PPO | Admitting: Podiatry

## 2018-08-28 DIAGNOSIS — M659 Synovitis and tenosynovitis, unspecified: Secondary | ICD-10-CM | POA: Diagnosis not present

## 2018-09-02 NOTE — Progress Notes (Signed)
   Subjective:  43 year old female presenting today for follow up evaluation of left ankle pain. She states the injection she received at her last appointment was helpful in relieving the pain. She states wearing the boot also helps. She has been taking Meloxicam but reports no significant relief from it. There are no worsening factors noted. Patient is here for further evaluation and treatment.   Past Medical History:  Diagnosis Date  . Asthma   . Bronchitis   . Diabetes mellitus without complication ([place])   . DVT (deep venous thrombosis) ([place])   . Obesity      Objective / Physical Exam:  General:  The patient is alert and oriented x3 in no acute distress. Dermatology:  Skin is warm, dry and supple bilateral lower extremities. Negative for open lesions or macerations. Vascular:  Palpable pedal pulses bilaterally. No edema or erythema noted. Capillary refill within normal limits. Neurological:  Epicritic and protective threshold grossly intact bilaterally.  Musculoskeletal Exam:  Pain on palpation to the anterior lateral medial aspects of the patient's left ankle. Mild edema noted. Range of motion within normal limits to all pedal and ankle joints bilateral. Muscle strength 5/5 in all groups bilateral.   Assessment: 1. Left ankle synovitis   Plan of Care:  1. Patient was evaluated.   2. injection of 0.5 mL Celestone Soluspan injected in the patient's left ankle. 3. Continue taking Meloxicam. 4. Discontinue using CAM boot.  5. Ankle brace dispensed.  6. Return to clinic in 4 weeks.   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    Edrick Kins, DPM [facility]  Dr. Edrick Kins, [address]                                        [place], [place] [address]                Office [phone]  Fax [phone]

## 2018-09-07 ENCOUNTER — Encounter: Payer: Self-pay | Admitting: Physician Assistant

## 2018-09-07 ENCOUNTER — Other Ambulatory Visit: Payer: Self-pay

## 2018-09-07 ENCOUNTER — Ambulatory Visit
Admission: EM | Admit: 2018-09-07 | Discharge: 2018-09-07 | Disposition: A | Discharge status: Home / Self Care | Payer: BC Managed Care – PPO | Attending: Physician Assistant | Admitting: Physician Assistant

## 2018-09-07 DIAGNOSIS — J101 Influenza due to other identified influenza virus with other respiratory manifestations: Secondary | ICD-10-CM

## 2018-09-07 LAB — POCT INFLUENZA A/B
INFLUENZA B, POC: NEGATIVE
Influenza A, POC: POSITIVE — AB

## 2018-09-07 MED ORDER — ACETAMINOPHEN 325 MG PO TABS
975.0000 mg | ORAL_TABLET | Freq: Once | ORAL | Status: AC
  Administered 2018-09-07: 975 mg via ORAL

## 2018-09-07 MED ORDER — OSELTAMIVIR PHOSPHATE 75 MG PO CAPS: 75.0000 mg | ORAL_CAPSULE | Freq: Two times a day (BID) | ORAL | 0 refills | Status: DC

## 2018-09-07 MED ORDER — ALBUTEROL SULFATE HFA 108 (90 BASE) MCG/ACT IN AERS
2.0000 | INHALATION_SPRAY | Freq: Once | RESPIRATORY_TRACT | Status: AC
  Administered 2018-09-07: 2 via RESPIRATORY_TRACT

## 2018-09-07 NOTE — ED Triage Notes (Signed)
Per pt she has been having cough, congestion, fevers, body aches and chills for 2 days now as of today.  Pt having headaches. Pt having SOB no chest pain. Cough is some productive but nothing in color with the sputum.

## 2018-09-07 NOTE — ED Provider Notes (Signed)
[facility]    CSN: [id number] Arrival date & time: 09/07/18  1452     History   Chief Complaint Chief Complaint  Patient presents with  . Cough    flu like symptoms  . Nasal Congestion    HPI Marie Spears is a 43 y.o. female.   43 year old female comes in for 2-day history of flulike symptoms.  Has had acute onset of cough, congestion, body aches, chills, fevers.  She has had mild headache.  She denies chest pain.  Has had some shortness of breath, but has run out of her albuterol inhaler and has not used.  She has positive sick contact.  Never smoker.     Past Medical History:  Diagnosis Date  . Asthma   . Bronchitis   . Diabetes mellitus without complication ([place])   . DVT (deep venous thrombosis) ([place])   . Obesity     Patient Active Problem List   Diagnosis Date Noted  . Asthma 08/03/2018  . Status post right cataract extraction 11/29/2017  . Age-related nuclear cataract of left eye 10/24/2017  . Anterior subcapsular cataract of right eye 10/24/2017  . Posterior subcapsular age-related cataract of both eyes 10/24/2017  . Diabetes mellitus ([place]) 07/21/2016  . Hyperlipidemia 07/21/2016  . Hidradenitis suppurativa 07/20/2016  . Diabetes mellitus type 2 in obese ([place]) 03/11/2015  . Morbid obesity ([place]) 03/11/2015    Past Surgical History:  Procedure Laterality Date  . HERNIA REPAIR      OB History   No obstetric history on file.      Home Medications    Prior to Admission medications   Medication Sig Start Date End Date Taking? Authorizing Provider  albuterol (PROVENTIL HFA;VENTOLIN HFA) 108 (90 Base) MCG/ACT inhaler Inhale 2 puffs into the lungs every 4 (four) hours as needed for wheezing or shortness of breath. 10/28/17   Horton, Barbette Hair, MD  insulin degludec (TRESIBA) 100 UNIT/ML SOPN FlexTouch Pen Inject 0.1 mLs (10 Units total) into the skin daily. 06/09/18   Molpus, Jenny Reichmann, MD  metFORMIN (GLUCOPHAGE) 500 MG tablet Take 1  tablet (500 mg total) by mouth 2 (two) times daily with a meal. 06/09/18   Molpus, John, MD  oseltamivir (TAMIFLU) 75 MG capsule Take 1 capsule (75 mg total) by mouth every 12 (twelve) hours. 09/07/18   Ok Edwards, PA-C    Family History No family history on file.  Social History Social History   Tobacco Use  . Smoking status: Never Smoker  . Smokeless tobacco: Never Used  Substance Use Topics  . Alcohol use: No  . Drug use: No     Allergies   Penicillins   Review of Systems Review of Systems  Reason unable to perform ROS: See HPI as above.     Physical Exam Triage Vital Signs ED Triage Vitals  Enc Vitals Group     BP 09/07/18 1505 (!) 151/88     Pulse Rate 09/07/18 1505 90     Resp 09/07/18 1505 (!) 24     Temp 09/07/18 1505 (!) 101.7 F (38.7 C)     Temp Source 09/07/18 1505 Oral     SpO2 09/07/18 1505 96 %     Weight 09/07/18 1510 (!) 344 lb (156 kg)     Height 09/07/18 1510 6' (1.829 m)     Head Circumference --      Peak Flow --      Pain Score 09/07/18 1509 8  Pain Loc --      Pain Edu? --      Excl. in [place]? --    No data found.  Updated Vital Signs BP (!) 151/88 (BP Location: Left Arm)   Pulse 90   Temp (!) 101.7 F (38.7 C) (Oral)   Resp (!) 24   Ht 6' (1.829 m)   Wt (!) 344 lb (156 kg)   SpO2 96%   BMI 46.65 kg/m   Physical Exam Constitutional:      General: She is not in acute distress.    Appearance: She is well-developed. She is not ill-appearing, toxic-appearing or diaphoretic.  HENT:     Head: Normocephalic and atraumatic.     Right Ear: Tympanic membrane, ear canal and external ear normal. Tympanic membrane is not erythematous or bulging.     Left Ear: Tympanic membrane, ear canal and external ear normal. Tympanic membrane is not erythematous or bulging.     Nose:     Right Sinus: Maxillary sinus tenderness and frontal sinus tenderness present.     Left Sinus: Maxillary sinus tenderness and frontal sinus tenderness present.      Mouth/Throat:     Mouth: Mucous membranes are moist.     Pharynx: Oropharynx is clear. Uvula midline.  Eyes:     Conjunctiva/sclera: Conjunctivae normal.     Pupils: Pupils are equal, round, and reactive to light.  Neck:     Musculoskeletal: Normal range of motion and neck supple.  Cardiovascular:     Rate and Rhythm: Normal rate and regular rhythm.     Heart sounds: Normal heart sounds. No murmur. No friction rub. No gallop.   Pulmonary:     Effort: Pulmonary effort is normal. No accessory muscle usage, prolonged expiration, respiratory distress or retractions.     Breath sounds: Normal breath sounds. No stridor, decreased air movement or transmitted upper airway sounds. No decreased breath sounds, wheezing, rhonchi or rales.  Skin:    General: Skin is warm and dry.  Neurological:     Mental Status: She is alert and oriented to person, place, and time.      UC Treatments / Results  Labs (all labs ordered are listed, but only abnormal results are displayed) Labs Reviewed  POCT INFLUENZA A/B - Abnormal; Notable for the following components:      Result Value   Influenza A, POC Positive (*)    All other components within normal limits    EKG None  Radiology No results found.  Procedures Procedures (including critical care time)  Medications Ordered in UC Medications  acetaminophen (TYLENOL) tablet 975 mg (975 mg Oral Given 09/07/18 1518)  albuterol (PROVENTIL HFA;VENTOLIN HFA) 108 (90 Base) MCG/ACT inhaler 2 puff (2 puffs Inhalation Given 09/07/18 1544)    Initial Impression / Assessment and Plan / UC Course  I have reviewed the triage vital signs and the nursing notes.  Pertinent labs & imaging results that were available during my care of the patient were reviewed by me and considered in my medical decision making (see chart for details).    Influenza A positive.  Given patient with history of asthma, and diabetes, will have patient start Tamiflu.  Albuterol inhaler  as needed.  Other symptomatic treatment discussed.  Push fluids.  Return precautions given.  Patient expresses understanding and agrees to plan.  Final Clinical Impressions(s) / UC Diagnoses   Final diagnoses:  Influenza A    ED Prescriptions    Medication Sig Dispense Auth.  Provider   oseltamivir (TAMIFLU) 75 MG capsule Take 1 capsule (75 mg total) by mouth every 12 (twelve) hours. 10 capsule Tobin Chad, [place] 09/07/18 1644

## 2018-09-07 NOTE — Discharge Instructions (Signed)
Start tamiflu as directed. Albuterol inhaler as needed. You can use over the counter medicine such as flonase, zyrtec for nasal congestion/drainage. You can use over the counter nasal saline rinse such as neti pot for nasal congestion. Keep hydrated, your urine should be clear to pale yellow in color. Tylenol/motrin for fever and pain. Monitor for any worsening of symptoms, chest pain, shortness of breath, wheezing, swelling of the throat, follow up for reevaluation.   For sore throat/cough try using a honey-based tea. Use 3 teaspoons of honey with juice squeezed from half lemon. Place shaved pieces of ginger into 1/2-1 cup of water and warm over stove top. Then mix the ingredients and repeat every 4 hours as needed.

## 2018-09-25 ENCOUNTER — Ambulatory Visit: Payer: BC Managed Care – PPO | Admitting: Podiatry

## 2019-01-29 ENCOUNTER — Emergency Department (HOSPITAL_BASED_OUTPATIENT_CLINIC_OR_DEPARTMENT_OTHER)
Admission: EM | Admit: 2019-01-29 | Discharge: 2019-01-29 | Disposition: A | Discharge status: Home / Self Care | Payer: BC Managed Care – PPO | Attending: Emergency Medicine | Admitting: Emergency Medicine

## 2019-01-29 ENCOUNTER — Other Ambulatory Visit: Payer: Self-pay

## 2019-01-29 ENCOUNTER — Encounter (HOSPITAL_BASED_OUTPATIENT_CLINIC_OR_DEPARTMENT_OTHER): Payer: Self-pay

## 2019-01-29 DIAGNOSIS — J45909 Unspecified asthma, uncomplicated: Secondary | ICD-10-CM | POA: Diagnosis not present

## 2019-01-29 DIAGNOSIS — N739 Female pelvic inflammatory disease, unspecified: Secondary | ICD-10-CM

## 2019-01-29 DIAGNOSIS — Z7984 Long term (current) use of oral hypoglycemic drugs: Secondary | ICD-10-CM | POA: Diagnosis not present

## 2019-01-29 DIAGNOSIS — E119 Type 2 diabetes mellitus without complications: Secondary | ICD-10-CM | POA: Insufficient documentation

## 2019-01-29 DIAGNOSIS — L0291 Cutaneous abscess, unspecified: Secondary | ICD-10-CM | POA: Diagnosis present

## 2019-01-29 DIAGNOSIS — L02215 Cutaneous abscess of perineum: Secondary | ICD-10-CM | POA: Insufficient documentation

## 2019-01-29 LAB — CBG MONITORING, ED: Glucose-Capillary: 284 mg/dL — ABNORMAL HIGH (ref 70–99)

## 2019-01-29 MED ORDER — HYDROCODONE-ACETAMINOPHEN 5-325 MG PO TABS
1.0000 | ORAL_TABLET | Freq: Once | ORAL | Status: AC
  Administered 2019-01-29: 23:00:00 1 via ORAL
  Filled 2019-01-29: qty 1

## 2019-01-29 MED ORDER — LIDOCAINE-EPINEPHRINE (PF) 2 %-1:200000 IJ SOLN
10.0000 mL | Freq: Once | INTRAMUSCULAR | Status: DC
  Filled 2019-01-29 (×2): qty 10

## 2019-01-29 MED ORDER — PENTAFLUOROPROP-TETRAFLUOROETH EX AERO
INHALATION_SPRAY | CUTANEOUS | Status: AC
  Filled 2019-01-29: qty 30

## 2019-01-29 MED ORDER — PENTAFLUOROPROP-TETRAFLUOROETH EX AERO: INHALATION_SPRAY | CUTANEOUS | Status: DC | PRN

## 2019-01-29 NOTE — ED Provider Notes (Signed)
[facility] EMERGENCY DEPARTMENT Provider Note   CSN: [id number] Arrival date & time: 01/29/19  2006     History   Chief Complaint Chief Complaint  Patient presents with  . Abscess    HPI Marie Spears is a 43 y.o. female.     The history is provided by the patient.  Abscess Location:  Pelvis Ano-genital abscess location: mons pubis. Size:  Size of a golf ball Abscess quality: fluctuance, induration, painful and redness   Red streaking: no   Duration:  2 weeks Progression:  Worsening Pain details:    Quality:  Throbbing and shooting   Severity:  Severe   Duration:  2 weeks   Timing:  Constant   Progression:  Worsening Chronicity:  Recurrent Context: diabetes   Context comment:  Started after shaving 2 weeks ago Relieved by:  Nothing Worsened by:  Draining/squeezing Ineffective treatments:  None tried Associated symptoms: no fever, no nausea and no vomiting   Risk factors: prior abscess     Past Medical History:  Diagnosis Date  . Asthma   . Bronchitis   . Diabetes mellitus without complication ([place])   . DVT (deep venous thrombosis) ([place])   . Obesity     Patient Active Problem List   Diagnosis Date Noted  . Asthma 08/03/2018  . Status post right cataract extraction 11/29/2017  . Age-related nuclear cataract of left eye 10/24/2017  . Anterior subcapsular cataract of right eye 10/24/2017  . Posterior subcapsular age-related cataract of both eyes 10/24/2017  . Diabetes mellitus ([place]) 07/21/2016  . Hyperlipidemia 07/21/2016  . Hidradenitis suppurativa 07/20/2016  . Diabetes mellitus type 2 in obese ([place]) 03/11/2015  . Morbid obesity ([place]) 03/11/2015    Past Surgical History:  Procedure Laterality Date  . HERNIA REPAIR       OB History   No obstetric history on file.      Home Medications    Prior to Admission medications   Medication Sig Start Date End Date Taking? Authorizing Provider  albuterol (PROVENTIL HFA;VENTOLIN  HFA) 108 (90 Base) MCG/ACT inhaler Inhale 2 puffs into the lungs every 4 (four) hours as needed for wheezing or shortness of breath. 10/28/17   Horton, Barbette Hair, MD  insulin degludec (TRESIBA) 100 UNIT/ML SOPN FlexTouch Pen Inject 0.1 mLs (10 Units total) into the skin daily. 06/09/18   Molpus, Jenny Reichmann, MD  metFORMIN (GLUCOPHAGE) 500 MG tablet Take 1 tablet (500 mg total) by mouth 2 (two) times daily with a meal. 06/09/18   Molpus, John, MD  oseltamivir (TAMIFLU) 75 MG capsule Take 1 capsule (75 mg total) by mouth every 12 (twelve) hours. 09/07/18   Ok Edwards, PA-C    Family History No family history on file.  Social History Social History   Tobacco Use  . Smoking status: Never Smoker  . Smokeless tobacco: Never Used  Substance Use Topics  . Alcohol use: No  . Drug use: No     Allergies   Penicillins   Review of Systems Review of Systems  Constitutional: Negative for fever.  Gastrointestinal: Negative for nausea and vomiting.  All other systems reviewed and are negative.    Physical Exam Updated Vital Signs BP (!) 146/97 (BP Location: Left Arm)   Pulse 86   Temp 98.7 F (37.1 C) (Oral)   Resp 18   Ht 6' (1.829 m)   Wt (!) 153.8 kg   LMP 01/05/2019   SpO2 98%   BMI 45.98 kg/m   Physical  Exam Vitals signs and nursing note reviewed.  Constitutional:      General: She is not in acute distress.    Appearance: Normal appearance. She is obese.  HENT:     Head: Normocephalic.  Eyes:     Pupils: Pupils are equal, round, and reactive to light.  Cardiovascular:     Rate and Rhythm: Normal rate.  Pulmonary:     Effort: Pulmonary effort is normal.  Abdominal:     General: Abdomen is flat.     Tenderness: There is no abdominal tenderness.    Musculoskeletal:        General: No tenderness.  Skin:    General: Skin is warm and dry.  Neurological:     General: No focal deficit present.     Mental Status: She is alert. Mental status is at baseline.  Psychiatric:         Mood and Affect: Mood normal.        Behavior: Behavior normal.        Thought Content: Thought content normal.      ED Treatments / Results  Labs (all labs ordered are listed, but only abnormal results are displayed) Labs Reviewed  CBG MONITORING, ED - Abnormal; Notable for the following components:      Result Value   Glucose-Capillary 284 (*)    All other components within normal limits    EKG None  Radiology No results found.  Procedures Procedures (including critical care time) INCISION AND DRAINAGE Performed by: Blanchie Dessert Consent: Verbal consent obtained. Risks and benefits: risks, benefits and alternatives were discussed Type: abscess  Body area: mons pubis  Anesthesia: local infiltration  Incision was made with a scalpel.  Local anesthetic: lidocaine 2% with epinephrine  Anesthetic total: 5 ml  Complexity: complex Blunt dissection to break up loculations  Drainage: purulent  Drainage amount: 52mL  Packing material: 1/4 in iodoform gauze  Patient tolerance: Patient tolerated the procedure well with no immediate complications.     Medications Ordered in ED Medications  lidocaine-EPINEPHrine (XYLOCAINE W/EPI) 2 %-1:200000 (PF) injection 10 mL (has no administration in time range)  pentafluoroprop-tetrafluoroeth (GEBAUERS) aerosol (has no administration in time range)  pentafluoroprop-tetrafluoroeth (GEBAUERS) aerosol (has no administration in time range)  HYDROcodone-acetaminophen (NORCO/VICODIN) 5-325 MG per tablet 1 tablet (has no administration in time range)     Initial Impression / Assessment and Plan / ED Course  I have reviewed the triage vital signs and the nursing notes.  Pertinent labs & imaging results that were available during my care of the patient were reviewed by me and considered in my medical decision making (see chart for details).        Patient presenting with abscess over the mons pubis that she is developed  over the last 2 weeks after shaving.  She has no surrounding cellulitis or involvement of the labia.  She has no systemic symptoms.  Patient is diabetic and states she still taking metformin and one other medication but does not check her sugar regularly.  Blood sugar today is 284 which patient states is better than what it normally is.  She is still working on getting in with a regular doctor.  I&D as above.  Patient instructed to return if symptoms worsen.  Final Clinical Impressions(s) / ED Diagnoses   Final diagnoses:  Abscess of female pelvis    ED Discharge Orders    None       Blanchie Dessert, MD 01/29/19 2243

## 2019-01-29 NOTE — ED Triage Notes (Signed)
Pt c/o abscess to external vaginal area x 2 weeks-NAD-steady gait

## 2019-01-29 NOTE — ED Notes (Signed)
Pt discharged to home with family. NAD.  

## 2019-01-29 NOTE — Discharge Instructions (Signed)
Continue to do warm soaks in the tub or warm compresses 20 minutes 2-3 times a day.  If the packing is still present on Saturday just pull it out.  Take Tylenol or ibuprofen as needed for pain

## 2019-02-06 ENCOUNTER — Ambulatory Visit: Payer: BC Managed Care – PPO | Admitting: Family Medicine

## 2019-02-06 DIAGNOSIS — J4520 Mild intermittent asthma, uncomplicated: Secondary | ICD-10-CM | POA: Insufficient documentation

## 2019-05-27 DIAGNOSIS — Z9884 Bariatric surgery status: Secondary | ICD-10-CM | POA: Insufficient documentation

## 2019-06-23 HISTORY — PX: BARIATRIC SURGERY: SHX1103

## 2019-06-25 DIAGNOSIS — I10 Essential (primary) hypertension: Secondary | ICD-10-CM | POA: Insufficient documentation

## 2019-06-29 ENCOUNTER — Other Ambulatory Visit: Payer: Self-pay

## 2019-06-29 ENCOUNTER — Encounter (HOSPITAL_BASED_OUTPATIENT_CLINIC_OR_DEPARTMENT_OTHER): Payer: Self-pay | Admitting: Emergency Medicine

## 2019-06-29 ENCOUNTER — Emergency Department (HOSPITAL_BASED_OUTPATIENT_CLINIC_OR_DEPARTMENT_OTHER): Payer: BC Managed Care – PPO

## 2019-06-29 ENCOUNTER — Emergency Department (HOSPITAL_BASED_OUTPATIENT_CLINIC_OR_DEPARTMENT_OTHER)
Admission: EM | Admit: 2019-06-29 | Discharge: 2019-06-29 | Disposition: A | Discharge status: Home / Self Care | Payer: BC Managed Care – PPO | Attending: Emergency Medicine | Admitting: Emergency Medicine

## 2019-06-29 DIAGNOSIS — E119 Type 2 diabetes mellitus without complications: Secondary | ICD-10-CM | POA: Insufficient documentation

## 2019-06-29 DIAGNOSIS — R5383 Other fatigue: Secondary | ICD-10-CM | POA: Diagnosis not present

## 2019-06-29 DIAGNOSIS — J45909 Unspecified asthma, uncomplicated: Secondary | ICD-10-CM | POA: Diagnosis not present

## 2019-06-29 DIAGNOSIS — R0602 Shortness of breath: Secondary | ICD-10-CM | POA: Insufficient documentation

## 2019-06-29 DIAGNOSIS — Z794 Long term (current) use of insulin: Secondary | ICD-10-CM | POA: Diagnosis not present

## 2019-06-29 DIAGNOSIS — Z79899 Other long term (current) drug therapy: Secondary | ICD-10-CM | POA: Diagnosis not present

## 2019-06-29 DIAGNOSIS — R0789 Other chest pain: Secondary | ICD-10-CM | POA: Insufficient documentation

## 2019-06-29 DIAGNOSIS — R2243 Localized swelling, mass and lump, lower limb, bilateral: Secondary | ICD-10-CM | POA: Diagnosis not present

## 2019-06-29 LAB — COMPREHENSIVE METABOLIC PANEL
ALT: 38 U/L (ref 0–44)
AST: 29 U/L (ref 15–41)
Albumin: 4 g/dL (ref 3.5–5.0)
Alkaline Phosphatase: 74 U/L (ref 38–126)
Anion gap: 10 (ref 5–15)
BUN: 14 mg/dL (ref 6–20)
CO2: 26 mmol/L (ref 22–32)
Calcium: 9.4 mg/dL (ref 8.9–10.3)
Chloride: 99 mmol/L (ref 98–111)
Creatinine, Ser: 0.94 mg/dL (ref 0.44–1.00)
GFR calc Af Amer: >60 mL/min (ref >60)
GFR calc non Af Amer: >60 mL/min (ref >60)
Glucose, Bld: 103 mg/dL — ABNORMAL HIGH (ref 70–99)
Potassium: 3.9 mmol/L (ref 3.5–5.1)
Sodium: 135 mmol/L (ref 135–145)
Total Bilirubin: 1.4 mg/dL — ABNORMAL HIGH (ref 0.3–1.2)
Total Protein: 7.8 g/dL (ref 6.5–8.1)

## 2019-06-29 LAB — CBC WITH DIFFERENTIAL/PLATELET
Abs Immature Granulocytes: 0.01 10*3/uL (ref 0.00–0.07)
Basophils Absolute: 0 10*3/uL (ref 0.0–0.1)
Basophils Relative: 1 %
Eosinophils Absolute: 0.1 10*3/uL (ref 0.0–0.5)
Eosinophils Relative: 2 %
HCT: 40.1 % (ref 36.0–46.0)
Hemoglobin: 13 g/dL (ref 12.0–15.0)
Immature Granulocytes: 0 %
Lymphocytes Relative: 33 %
Lymphs Abs: 1.9 10*3/uL (ref 0.7–4.0)
MCH: 26.4 pg (ref 26.0–34.0)
MCHC: 32.4 g/dL (ref 30.0–36.0)
MCV: 81.3 fL (ref 80.0–100.0)
Monocytes Absolute: 0.6 10*3/uL (ref 0.1–1.0)
Monocytes Relative: 10 %
Neutro Abs: 3.2 10*3/uL (ref 1.7–7.7)
Neutrophils Relative %: 54 %
Platelets: 298 10*3/uL (ref 150–400)
RBC: 4.93 MIL/uL (ref 3.87–5.11)
RDW: 13.7 % (ref 11.5–15.5)
WBC: 5.8 10*3/uL (ref 4.0–10.5)
nRBC: 0 % (ref 0.0–0.2)

## 2019-06-29 LAB — TROPONIN I (HIGH SENSITIVITY): Troponin I (High Sensitivity): 4 ng/L (ref <18)

## 2019-06-29 MED ORDER — IOHEXOL 350 MG/ML SOLN
100.0000 mL | Freq: Once | INTRAVENOUS | Status: AC | PRN
  Administered 2019-06-29: 14:00:00 90 mL via INTRAVENOUS

## 2019-06-29 NOTE — ED Triage Notes (Signed)
Bilateral leg pain and SOB with exertion x 2 days.

## 2019-06-29 NOTE — ED Provider Notes (Signed)
[facility] EMERGENCY DEPARTMENT Provider Note   CSN: [id number] Arrival date & time: 06/29/19  1111     History Chief Complaint  Patient presents with   Shortness of Breath   Leg Pain    Marie Spears is a 44 y.o. female.  HPI Patient had gastric surgery a week ago.  Now complaining of shortness of breath and leg swelling.  States she is more fatigued.  Has some mild upper chest pain.  States her legs feel more swollen.  She is not on anticoagulation.  No fevers.  No cough.  Pain is dull.  Not exertional.    Past Medical History:  Diagnosis Date   Asthma    Bronchitis    Diabetes mellitus without complication ([place])    DVT (deep venous thrombosis) ([place])    Obesity     Patient Active Problem List   Diagnosis Date Noted   Asthma 08/03/2018   Status post right cataract extraction 11/29/2017   Age-related nuclear cataract of left eye 10/24/2017   Anterior subcapsular cataract of right eye 10/24/2017   Posterior subcapsular age-related cataract of both eyes 10/24/2017   Diabetes mellitus ([place]) 07/21/2016   Hyperlipidemia 07/21/2016   Hidradenitis suppurativa 07/20/2016   Diabetes mellitus type 2 in obese ([place]) 03/11/2015   Morbid obesity ([place]) 03/11/2015    Past Surgical History:  Procedure Laterality Date   HERNIA REPAIR       OB History   No obstetric history on file.     No family history on file.  Social History   Tobacco Use   Smoking status: Never Smoker   Smokeless tobacco: Never Used  Substance Use Topics   Alcohol use: No   Drug use: No    Home Medications Prior to Admission medications   Medication Sig Start Date End Date Taking? Authorizing Provider  albuterol (PROVENTIL HFA;VENTOLIN HFA) 108 (90 Base) MCG/ACT inhaler Inhale 2 puffs into the lungs every 4 (four) hours as needed for wheezing or shortness of breath. 10/28/17   Horton, Barbette Hair, MD  insulin degludec (TRESIBA) 100 UNIT/ML SOPN FlexTouch Pen  Inject 0.1 mLs (10 Units total) into the skin daily. 06/09/18   Molpus, Jenny Reichmann, MD  metFORMIN (GLUCOPHAGE) 500 MG tablet Take 1 tablet (500 mg total) by mouth 2 (two) times daily with a meal. 06/09/18   Molpus, John, MD  oseltamivir (TAMIFLU) 75 MG capsule Take 1 capsule (75 mg total) by mouth every 12 (twelve) hours. 09/07/18   Ok Edwards, PA-C    Allergies    Penicillins  Review of Systems   Review of Systems  Constitutional: Negative for appetite change.  HENT: Negative for congestion.   Respiratory: Positive for shortness of breath.   Cardiovascular: Positive for chest pain and leg swelling.  Genitourinary: Negative for flank pain.  Musculoskeletal: Negative for arthralgias and back pain.  Skin: Negative for rash.  Neurological: Negative for weakness.    Physical Exam Updated Vital Signs BP 133/75 (BP Location: Left Arm)    Pulse 80    Temp 98.7 F (37.1 C) (Oral)    Resp 16    Ht 6' (1.829 m)    Wt (!) 140.6 kg    LMP 06/11/2019    SpO2 100%    BMI 42.04 kg/m   Physical Exam Vitals reviewed.  Constitutional:      Appearance: She is obese.  Cardiovascular:     Rate and Rhythm: Regular rhythm.  Pulmonary:     Effort: No  respiratory distress.     Breath sounds: No wheezing, rhonchi or rales.  Chest:     Chest wall: No tenderness.  Abdominal:     Tenderness: There is abdominal tenderness.     Comments: Mild upper abdominal tenderness.  Musculoskeletal:     Right lower leg: Edema present.     Left lower leg: Edema present.  Skin:    General: Skin is warm.     Capillary Refill: Capillary refill takes less than 2 seconds.  Neurological:     General: No focal deficit present.     Mental Status: She is alert and oriented to person, place, and time.     ED Results / Procedures / Treatments   Labs (all labs ordered are listed, but only abnormal results are displayed) Labs Reviewed  COMPREHENSIVE METABOLIC PANEL - Abnormal; Notable for the following components:       Result Value   Glucose, Bld 103 (*)    Total Bilirubin 1.4 (*)    All other components within normal limits  CBC WITH DIFFERENTIAL/PLATELET  TROPONIN I (HIGH SENSITIVITY)    EKG EKG Interpretation  Date/Time:  Sunday June 29 2019 11:23:14 EST Ventricular Rate:  87 PR Interval:  146 QRS Duration: 82 QT Interval:  374 QTC Calculation: 450 R Axis:   7 Text Interpretation: Normal sinus rhythm Normal ECG Confirmed by Davonna Belling [phone]) on 06/29/2019 11:58:23 AM   Radiology DG Chest 2 View  Result Date: 06/29/2019 CLINICAL DATA:  Bilateral leg pain and SOB with exertion x 2 days. EXAM: CHEST - 2 VIEW COMPARISON:  Chest radiograph 09/26/2016 FINDINGS: The heart size and mediastinal contours are within normal limits. The lungs are clear. No pneumothorax or pleural effusion. Degenerative changes in the thoracic spine. IMPRESSION: No active cardiopulmonary disease. Electronically Signed   By: Audie Pinto M.D.   On: 06/29/2019 12:56   CT Angio Chest PE W and/or Wo Contrast  Result Date: 06/29/2019 CLINICAL DATA:  Shortness of breath, recent surgery, leg pain, previous gastric sleeve EXAM: CT ANGIOGRAPHY CHEST WITH CONTRAST TECHNIQUE: Multidetector CT imaging of the chest was performed using the standard protocol during bolus administration of intravenous contrast. Multiplanar CT image reconstructions and MIPs were obtained to evaluate the vascular anatomy. CONTRAST:  65mL OMNIPAQUE IOHEXOL 350 MG/ML SOLN COMPARISON:  12/05/2006 FINDINGS: Cardiovascular: No significant filling defect or pulmonary embolus demonstrated by CTA. Pulmonary arteries appear patent. No pericardial effusion. Normal heart size. Intact thoracic aorta. Mediastinum/Nodes: No enlarged mediastinal, hilar, or axillary lymph nodes. Thyroid gland, trachea, and esophagus demonstrate no significant findings. Lungs/Pleura: Minor dependent lower lobe hypoventilatory changes. No focal acute airspace process, collapse or  consolidation. No interstitial process or edema. No pleural abnormality, effusion or pneumothorax. Trachea and central airways are patent. Upper Abdomen: Gastric sleeve surgery changes noted. No acute finding in the upper abdomen. Musculoskeletal: Degenerative changes noted of the spine. No acute osseous finding. Review of the MIP images confirms the above findings. IMPRESSION: Negative for significant acute pulmonary embolus by CTA. No other acute intrathoracic finding by CT. Electronically Signed   By: Jerilynn Mages.  Shick M.D.   On: 06/29/2019 13:49   US Venous Img Lower Unilateral Right  Result Date: 06/29/2019 CLINICAL DATA:  C/o bilat foot pain radiating up both legs (R>L) x 2 days. S/p gastric sleeve surgery on 12/28. Obesity limits scan. Patient states no hx DVT but did have superficial thrombophlebitis in Lt leg in 2004. EXAM: RIGHT LOWER EXTREMITY VENOUS DOPPLER ULTRASOUND TECHNIQUE: Gray-scale sonography with graded  compression, as well as color Doppler and duplex ultrasound were performed to evaluate the lower extremity deep venous systems from the level of the common femoral vein and including the common femoral, femoral, profunda femoral, popliteal and calf veins including the posterior tibial, peroneal and gastrocnemius veins when visible. The superficial great saphenous vein was also interrogated. Spectral Doppler was utilized to evaluate flow at rest and with distal augmentation maneuvers in the common femoral, femoral and popliteal veins. COMPARISON:  Right lower extremity venous Doppler ultrasound 04/06/2007 FINDINGS: Contralateral Common Femoral Vein: Respiratory phasicity is normal and symmetric with the symptomatic side. No evidence of thrombus. Normal compressibility. Common Femoral Vein: No evidence of thrombus. Normal compressibility, respiratory phasicity and response to augmentation. Saphenofemoral Junction: No evidence of thrombus. Normal compressibility and flow on color Doppler imaging.  Profunda Femoral Vein: No evidence of thrombus. Normal compressibility and flow on color Doppler imaging. Femoral Vein: No evidence of thrombus. Normal compressibility, respiratory phasicity and response to augmentation. Popliteal Vein: No evidence of thrombus. Normal compressibility, respiratory phasicity and response to augmentation. Calf Veins: No evidence of thrombus in visualized portions. The peroneal vein was not seen. Normal compressibility and flow on color Doppler imaging. Superficial Great Saphenous Vein: No evidence of thrombus. Normal compressibility. Other Findings: Several superficial varicose veins noted in the right calf and thigh. IMPRESSION: No evidence of deep venous thrombosis in the right lower extremity. Electronically Signed   By: Audie Pinto M.D.   On: 06/29/2019 13:17    Procedures Procedures (including critical care time)  Medications Ordered in ED Medications  iohexol (OMNIPAQUE) 350 MG/ML injection 100 mL (90 mLs Intravenous Contrast Given 06/29/19 1330)    ED Course  I have reviewed the triage vital signs and the nursing notes.  Pertinent labs & imaging results that were available during my care of the patient were reviewed by me and considered in my medical decision making (see chart for details).    MDM Rules/Calculators/A&P                      Patient with shortness of breath.  Recent surgery.  I think she is high enough risk that needed CT angiography instead of D-dimer.  Negative Doppler right lower  extremity.  EKG reassuring.  Negative troponin.  X-ray reassuring.  Negative CTA.  Discharge home. Final Clinical Impression(s) / ED Diagnoses Final diagnoses:  Shortness of breath    Rx / DC Orders ED Discharge Orders    None       Davonna Belling, MD 06/29/19 [phone]

## 2019-09-23 DIAGNOSIS — I82409 Acute embolism and thrombosis of unspecified deep veins of unspecified lower extremity: Secondary | ICD-10-CM | POA: Insufficient documentation

## 2020-02-23 ENCOUNTER — Other Ambulatory Visit: Payer: Self-pay

## 2020-02-23 ENCOUNTER — Ambulatory Visit (INDEPENDENT_AMBULATORY_CARE_PROVIDER_SITE_OTHER): Payer: BC Managed Care – PPO | Admitting: Internal Medicine

## 2020-02-23 ENCOUNTER — Encounter (INDEPENDENT_AMBULATORY_CARE_PROVIDER_SITE_OTHER): Payer: Self-pay | Admitting: Internal Medicine

## 2020-02-23 VITALS — BP 115/70 | HR 81 | Temp 97.9°F | Ht 76.0 in | Wt 283.4 lb

## 2020-02-23 DIAGNOSIS — E669 Obesity, unspecified: Secondary | ICD-10-CM

## 2020-02-23 DIAGNOSIS — E119 Type 2 diabetes mellitus without complications: Secondary | ICD-10-CM

## 2020-02-23 NOTE — Patient Instructions (Signed)
[facility] Dietary Recommendations for Weight Loss What to Avoid . Avoid added sugars o Often added sugar can be found in processed foods such as many condiments, dry cereals, cakes, cookies, chips, crisps, crackers, candies, sweetened drinks, etc.  o Read labels and AVOID/DECREASE use of foods with the following in their ingredient list: Sugar, fructose, high fructose corn syrup, sucrose, glucose, maltose, dextrose, molasses, cane sugar, brown sugar, any type of syrup, agave nectar, etc.   . Avoid snacking in between meals . Avoid foods made with flour o If you are going to eat food made with flour, choose those made with whole-grains; and, minimize your consumption as much as is tolerable . Avoid processed foods o These foods are generally stocked in the middle of the grocery store. Focus on shopping on the perimeter of the grocery.  . Avoid Meat  o We recommend following a plant-based diet at [facility]. Thus, we recommend avoiding meat as a general rule. Consider eating beans, legumes, eggs, and/or dairy products for regular protein sources o If you plan on eating meat limit to 4 ounces of meat at a time and choose lean options such as Fish, chicken, turkey. Avoid red meat intake such as pork and/or steak What to Include . Vegetables o GREEN LEAFY VEGETABLES: Kale, spinach, mustard greens, collard greens, cabbage, broccoli, etc. o OTHER: Asparagus, cauliflower, eggplant, carrots, peas, Brussel sprouts, tomatoes, bell peppers, zucchini, beets, cucumbers, etc. . Grains, seeds, and legumes o Beans: kidney beans, black eyed peas, garbanzo beans, black beans, pinto beans, etc. o Whole, unrefined grains: brown rice, barley, bulgur, oatmeal, etc. . Healthy fats  o Avoid highly processed fats such as vegetable oil o Examples of healthy fats: avocado, olives, virgin olive oil, dark chocolate (?72% Cocoa), nuts (peanuts, almonds, walnuts, cashews, pecans, etc.) . None to Low  Intake of Animal Sources of Protein o Meat sources: chicken, turkey, salmon, tuna. Limit to 4 ounces of meat at one time. o Consider limiting dairy sources, but when choosing dairy focus on: PLAIN Greek yogurt, cottage cheese, high-protein milk . Fruit o Choose berries  When to Eat . Intermittent Fasting: o Choosing not to eat for a specific time period, but DO FOCUS ON HYDRATION when fasting o Multiple Techniques: - Time Restricted Eating: eat 3 meals in a day, each meal lasting no more than 60 minutes, no snacks between meals - 16-18 hour fast: fast for 16 to 18 hours up to 7 days a week. Often suggested to start with 2-3 nonconsecutive days per week.  . Remember the time you sleep is counted as fasting.  . Examples of eating schedule: Fast from 7:00pm-11:00am. Eat between 11:00am-7:00pm.  - 24-hour fast: fast for 24 hours up to every other day. Often suggested to start with 1 day per week . Remember the time you sleep is counted as fasting . Examples of eating schedule:  o Eating day: eat 2-3 meals on your eating day. If doing 2 meals, each meal should last no more than 90 minutes. If doing 3 meals, each meal should last no more than 60 minutes. Finish last meal by 7:00pm. o Fasting day: Fast until 7:00pm.  o IF YOU FEEL UNWELL FOR ANY REASON/IN ANY WAY WHEN FASTING, STOP FASTING BY EATING A NUTRITIOUS SNACK OR LIGHT MEAL o ALWAYS FOCUS ON HYDRATION DURING FASTS - Acceptable Hydration sources: water, broths, tea/coffee (black tea/coffee is best but using a small amount of whole-fat dairy products in coffee/tea is acceptable).  -   Poor Hydration Sources: anything with sugar or artificial sweeteners added to it  These recommendations have been developed for patients that are actively receiving medical care from either Dr. Mallory Enriques or Sarah Gray, DNP, NP-C at [facility]. These recommendations are developed for patients with specific medical conditions and are not meant to be  distributed or used by others that are not actively receiving care from either provider listed above at [facility]. It is not appropriate to participate in the above eating plans without proper medical supervision.   Reference: Fung, J. The obesity code. [place]: [place]; 2016.   

## 2020-02-23 NOTE — Progress Notes (Signed)
Metrics: Intervention Frequency ACO  Documented Smoking Status Yearly  Screened one or more times in 24 months  Cessation Counseling or  Active cessation medication Past 24 months  Past 24 months   Guideline developer: UpToDate (See UpToDate for funding source) Date Released: 2014       Wellness Office Visit  Subjective:  Patient ID: Marie Spears, female    DOB: [birth year]  Age: 44 y.o. MRN: [id number]  CC: This 44 year old lady comes to our practice as a new patient to establish care. HPI  She has a history of type 2 diabetes and had bariatric surgery in December 2020 and has lost significant amount of weight. She wants to continue to become healthier and has gained some weight back now. Her last hemoglobin A1c was checked in April 2021 and it was in the 6% range. Past Medical History:  Diagnosis Date  . Asthma   . Bronchitis   . Diabetes mellitus without complication ([place])   . DVT (deep venous thrombosis) ([place])   . Obesity    Past Surgical History:  Procedure Laterality Date  . BARIATRIC SURGERY  06/23/2019   [facility] 347lbs prior to surgery.  Marland Kitchen HERNIA REPAIR       Family History  Problem Relation Age of Onset  . Heart disease Mother   . Diabetes Mother   . Hypertension Father   . Diabetes Sister   . 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   Social History   Tobacco Use  . Smoking status: Never Smoker  . Smokeless tobacco: Never Used  Substance Use Topics  . Alcohol use: No    Current Meds  Medication Sig  . albuterol (PROVENTIL HFA;VENTOLIN HFA) 108 (90 Base) MCG/ACT inhaler Inhale 2 puffs into the lungs every 4 (four) hours as needed for wheezing or shortness of breath.  . Multiple Vitamins-Minerals (BARIATRIC MULTIVITAMINS/IRON PO) Take 45 mg by mouth daily.  . Multiple Vitamins-Minerals (VITAMIN D3  COMPLETE PO) Take 1,200 Units by mouth daily.  . Omega-3 Fatty Acids (FISH OIL BURP-LESS) 1000 MG CAPS Take 3,000 mg by mouth daily.      Depression screen [facility] 2/9 02/23/2020  Decreased Interest 0  Down, Depressed, Hopeless 0  PHQ - 2 Score 0     Objective:   Today's Vitals: BP 115/70 (BP Location: Left Arm, Patient Position: Sitting, Cuff Size: Normal)   Pulse 81   Temp 97.9 F (36.6 C) (Temporal)   Ht 6\' 4"  (1.93 m)   Wt 283 lb 6.4 oz (128.5 kg)   SpO2 96%   BMI 34.50 kg/m  Vitals with BMI 02/23/2020 06/29/2019 06/29/2019  Height 6\' 4"  - -  Weight 283 lbs 6 oz - -  BMI 10.62 - -  Systolic [phone]  Diastolic 70 75 89  Pulse 81 80 88     Physical Exam  She looks systemically well.  She is obese.  Blood pressure is well controlled.     Assessment   1. Diabetes mellitus without complication (HCC)   2. Obesity (BMI 30-39.9)       Tests ordered No orders of the defined types were placed in this encounter.    Plan: 1. Today I discussed nutrition and the concept of intermittent fasting combined with a plant-based diet and we also discussed the blue zones.  I discussed the importance of maintaining  hydration. 2. I will see her in about 6 weeks time to see how she is doing and we will check blood work then.  Today also I discussed COVID-19 vaccination, the signs and research behind it and I urged her to get vaccinated. 3. Today I spent 45 minutes with the patient discussing her overall health, nutrition and COVID-19 vaccination.   No orders of the defined types were placed in this encounter.   Doree Albee, MD

## 2020-04-12 ENCOUNTER — Ambulatory Visit (INDEPENDENT_AMBULATORY_CARE_PROVIDER_SITE_OTHER): Payer: BC Managed Care – PPO | Admitting: Internal Medicine

## 2020-04-12 ENCOUNTER — Encounter (INDEPENDENT_AMBULATORY_CARE_PROVIDER_SITE_OTHER): Payer: Self-pay | Admitting: Internal Medicine

## 2020-04-12 ENCOUNTER — Other Ambulatory Visit: Payer: Self-pay

## 2020-04-12 VITALS — BP 122/72 | HR 72 | Temp 97.7°F | Ht 76.0 in | Wt 282.6 lb

## 2020-04-12 DIAGNOSIS — E559 Vitamin D deficiency, unspecified: Secondary | ICD-10-CM

## 2020-04-12 DIAGNOSIS — R5381 Other malaise: Secondary | ICD-10-CM

## 2020-04-12 DIAGNOSIS — R5383 Other fatigue: Secondary | ICD-10-CM

## 2020-04-12 DIAGNOSIS — E119 Type 2 diabetes mellitus without complications: Secondary | ICD-10-CM

## 2020-04-12 DIAGNOSIS — Z131 Encounter for screening for diabetes mellitus: Secondary | ICD-10-CM

## 2020-04-12 DIAGNOSIS — E669 Obesity, unspecified: Secondary | ICD-10-CM | POA: Diagnosis not present

## 2020-04-12 NOTE — Progress Notes (Signed)
Metrics: Intervention Frequency ACO  Documented Smoking Status Yearly  Screened one or more times in 24 months  Cessation Counseling or  Active cessation medication Past 24 months  Past 24 months   Guideline developer: UpToDate (See UpToDate for funding source) Date Released: 2014       Wellness Office Visit  Subjective:  Patient ID: Marie Spears, female    DOB: 05/09/1976  Age: 44 y.o. MRN: [id number]  CC: This lady comes in for follow-up of diabetes, obesity. HPI  Unfortunately, she has been under a lot of stress and has not been sleeping well at all.  The combination of stress and poor sleep will make it very difficult for her to lose weight and become healthier. She would rather try and treat her diabetes without medications if possible. She does have a history of bariatric surgery in which she lost significant amount of weight, starting weight was 347 pounds. Past Medical History:  Diagnosis Date  . Asthma   . Bronchitis   . Diabetes mellitus without complication ([place])   . DVT (deep venous thrombosis) ([place])   . Obesity    Past Surgical History:  Procedure Laterality Date  . BARIATRIC SURGERY  06/23/2019   [facility] 347lbs prior to surgery.  Marland Kitchen HERNIA REPAIR       Family History  Problem Relation Age of Onset  . Heart disease Mother   . Diabetes Mother   . Hypertension Father   . Diabetes Sister   . 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   Social History   Tobacco Use  . Smoking status: Never Smoker  . Smokeless tobacco: Never Used  Substance Use Topics  . Alcohol use: No    Current Meds  Medication Sig  . albuterol (PROVENTIL HFA;VENTOLIN HFA) 108 (90 Base) MCG/ACT inhaler Inhale 2 puffs into the lungs every 4 (four) hours as needed for wheezing or shortness of breath.  . calcium citrate-vitamin D  (CITRACAL+D) 315-200 MG-UNIT tablet Take by mouth.  . Multiple Vitamins-Minerals (BARIATRIC MULTIVITAMINS/IRON PO) Take 45 mg by mouth daily.  . Omega-3 Fatty Acids (FISH OIL BURP-LESS) 1000 MG CAPS Take 3,000 mg by mouth as needed.   . valACYclovir (VALTREX) 1000 MG tablet valacyclovir 1 gram tablet  TAKE 1 TABLET BY MOUTH THREE TIMES DAILY      Depression screen [facility] 2/9 02/23/2020  Decreased Interest 0  Down, Depressed, Hopeless 0  PHQ - 2 Score 0     Objective:   Today's Vitals: BP 122/72   Pulse 72   Temp 97.7 F (36.5 C) (Temporal)   Ht 6\' 4"  (1.93 m)   Wt 282 lb 9.6 oz (128.2 kg)   SpO2 98%   BMI 34.40 kg/m  Vitals with BMI 04/12/2020 02/23/2020 06/29/2019  Height 6\' 4"  6\' 4"  -  Weight 282 lbs 10 oz 283 lbs 6 oz -  BMI 09.23 30.07 -  Systolic 622 633 354  Diastolic 72 70 75  Pulse 72 81 80     Physical Exam  She remains obese.  Blood pressure is excellent.  No other new physical findings.     Assessment   1. Diabetes mellitus without complication (HCC)   2. Obesity (BMI 30-39.9)   3. Vitamin D deficiency disease   4. Malaise and fatigue   5. Screening for diabetes mellitus  Tests ordered Orders Placed This Encounter  Procedures  . CBC  . COMPLETE METABOLIC PANEL WITH GFR  . Hemoglobin A1c  . Lipid panel  . T3, free  . T4  . TSH  . VITAMIN D 25 Hydroxy (Vit-D Deficiency, Fractures)     Plan: 1. Blood work is ordered. 2. Further recommendations will depend on blood results and I will see her in 3 months time for follow-up.  She will continue to focus on nutrition.  We discussed the use of melatonin for insomnia.   No orders of the defined types were placed in this encounter.   Doree Albee, MD

## 2020-04-13 LAB — COMPLETE METABOLIC PANEL WITH GFR
AG Ratio: 1.4 (calc) (ref 1.0–2.5)
ALT: 15 U/L (ref 6–29)
AST: 19 U/L (ref 10–35)
Albumin: 4.1 g/dL (ref 3.6–5.1)
Alkaline phosphatase (APISO): 61 U/L (ref 31–125)
BUN/Creatinine Ratio: 10 (calc) (ref 6–22)
BUN: 12 mg/dL (ref 7–25)
CO2: 31 mmol/L (ref 20–32)
Calcium: 9.5 mg/dL (ref 8.6–10.2)
Chloride: 104 mmol/L (ref 98–110)
Creat: 1.15 mg/dL — ABNORMAL HIGH (ref 0.50–1.10)
GFR, Est African American: 66 mL/min/{1.73_m2} (ref >=60)
GFR, Est Non African American: 57 mL/min/{1.73_m2} — ABNORMAL LOW (ref >=60)
Globulin: 3 g/dL (calc) (ref 1.9–3.7)
Glucose, Bld: 101 mg/dL — ABNORMAL HIGH (ref 65–99)
Potassium: 4.6 mmol/L (ref 3.5–5.3)
Sodium: 141 mmol/L (ref 135–146)
Total Bilirubin: 1.1 mg/dL (ref 0.2–1.2)
Total Protein: 7.1 g/dL (ref 6.1–8.1)

## 2020-04-13 LAB — HEMOGLOBIN A1C
Hgb A1c MFr Bld: 6.1 % of total Hgb — ABNORMAL HIGH (ref <5.7)
Mean Plasma Glucose: 128 (calc)
eAG (mmol/L): 7.1 (calc)

## 2020-04-13 LAB — CBC
HCT: 35 % (ref 35.0–45.0)
Hemoglobin: 11.6 g/dL — ABNORMAL LOW (ref 11.7–15.5)
MCH: 27.4 pg (ref 27.0–33.0)
MCHC: 33.1 g/dL (ref 32.0–36.0)
MCV: 82.7 fL (ref 80.0–100.0)
MPV: 12.5 fL (ref 7.5–12.5)
Platelets: 221 10*3/uL (ref 140–400)
RBC: 4.23 10*6/uL (ref 3.80–5.10)
RDW: 13.6 % (ref 11.0–15.0)
WBC: 4.2 10*3/uL (ref 3.8–10.8)

## 2020-04-13 LAB — LIPID PANEL
Cholesterol: 186 mg/dL (ref <200)
HDL: 51 mg/dL (ref >=50)
LDL Cholesterol (Calc): 111 mg/dL (calc) — ABNORMAL HIGH
Non-HDL Cholesterol (Calc): 135 mg/dL (calc) — ABNORMAL HIGH (ref <130)
Total CHOL/HDL Ratio: 3.6 (calc) (ref <5.0)
Triglycerides: 129 mg/dL (ref <150)

## 2020-04-13 LAB — T3, FREE: T3, Free: 2.5 pg/mL (ref 2.3–4.2)

## 2020-04-13 LAB — T4: T4, Total: 9.3 ug/dL (ref 5.1–11.9)

## 2020-04-13 LAB — TSH: TSH: 1.11 mIU/L

## 2020-04-13 LAB — VITAMIN D 25 HYDROXY (VIT D DEFICIENCY, FRACTURES): Vit D, 25-Hydroxy: 29 ng/mL — ABNORMAL LOW (ref 30–100)

## 2020-04-20 ENCOUNTER — Encounter (INDEPENDENT_AMBULATORY_CARE_PROVIDER_SITE_OTHER): Payer: Self-pay | Admitting: Internal Medicine

## 2020-04-20 ENCOUNTER — Ambulatory Visit (INDEPENDENT_AMBULATORY_CARE_PROVIDER_SITE_OTHER): Payer: BC Managed Care – PPO | Admitting: Internal Medicine

## 2020-04-20 ENCOUNTER — Other Ambulatory Visit: Payer: Self-pay

## 2020-04-20 VITALS — BP 136/88 | HR 68 | Temp 97.0°F | Resp 18 | Ht 72.0 in | Wt 285.7 lb

## 2020-04-20 DIAGNOSIS — N390 Urinary tract infection, site not specified: Secondary | ICD-10-CM

## 2020-04-20 DIAGNOSIS — L659 Nonscarring hair loss, unspecified: Secondary | ICD-10-CM | POA: Diagnosis not present

## 2020-04-20 DIAGNOSIS — R5381 Other malaise: Secondary | ICD-10-CM

## 2020-04-20 DIAGNOSIS — R5383 Other fatigue: Secondary | ICD-10-CM

## 2020-04-20 DIAGNOSIS — D649 Anemia, unspecified: Secondary | ICD-10-CM

## 2020-04-20 DIAGNOSIS — R6889 Other general symptoms and signs: Secondary | ICD-10-CM

## 2020-04-20 MED ORDER — NP THYROID 30 MG PO TABS: 30.0000 mg | ORAL_TABLET | Freq: Every day | ORAL | 3 refills | Status: DC

## 2020-04-20 NOTE — Progress Notes (Signed)
Metrics: Intervention Frequency ACO  Documented Smoking Status Yearly  Screened one or more times in 24 months  Cessation Counseling or  Active cessation medication Past 24 months  Past 24 months   Guideline developer: UpToDate (See UpToDate for funding source) Date Released: 2014       Wellness Office Visit  Subjective:  Patient ID: Marie Spears, female    DOB: Apr 10, 1976  Age: 44 y.o. MRN: [id number]  CC: I asked this lady to come in earlier because of blood work that was abnormal.  She is anemic and renal function is slightly impaired.  Also her T3 levels are in a low range. HPI  She tells me that she has not been taking her iron supplementation after bariatric surgery on a regular basis and has begun to take them again the last 2 to 3 days. She describes symptoms of fatigue, hair loss, cold intolerance and tendency towards constipation and brain fog.  Her free T3 levels are only 2.5. She also think she has symptoms of a UTI at the present time.  She has hesitancy micturition and some back pain. Past Medical History:  Diagnosis Date  . Asthma   . Bronchitis   . Diabetes mellitus without complication ([place])   . DVT (deep venous thrombosis) ([place])   . Obesity    Past Surgical History:  Procedure Laterality Date  . BARIATRIC SURGERY  06/23/2019   [facility] 347lbs prior to surgery.  Marland Kitchen HERNIA REPAIR       Family History  Problem Relation Age of Onset  . Heart disease Mother   . Diabetes Mother   . Hypertension Father   . Diabetes Sister   . 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   Social History   Tobacco Use  . Smoking status: Never Smoker  . Smokeless tobacco: Never Used  Substance Use Topics  . Alcohol use: No    Current Meds  Medication Sig  . albuterol (PROVENTIL HFA;VENTOLIN HFA) 108 (90 Base) MCG/ACT inhaler  Inhale 2 puffs into the lungs every 4 (four) hours as needed for wheezing or shortness of breath.  . calcium citrate-vitamin D (CITRACAL+D) 315-200 MG-UNIT tablet Take by mouth.  . Cholecalciferol (VITAMIN D-3) 125 MCG (5000 UT) TABS Take 2 tablets by mouth daily.  . Multiple Vitamins-Minerals (BARIATRIC MULTIVITAMINS/IRON PO) Take 45 mg by mouth daily.  . Omega-3 Fatty Acids (FISH OIL BURP-LESS) 1000 MG CAPS Take 3,000 mg by mouth as needed.   . valACYclovir (VALTREX) 1000 MG tablet valacyclovir 1 gram tablet  TAKE 1 TABLET BY MOUTH THREE TIMES DAILY      Depression screen [facility] 2/9 02/23/2020  Decreased Interest 0  Down, Depressed, Hopeless 0  PHQ - 2 Score 0     Objective:   Today's Vitals: BP 136/88 (BP Location: Left Arm, Patient Position: Sitting, Cuff Size: Normal)   Pulse 68   Temp (!) 97 F (36.1 C) (Temporal)   Resp 18   Ht 6' (1.829 m)   Wt 285 lb 11.2 oz (129.6 kg)   SpO2 97%   BMI 38.75 kg/m  Vitals with BMI 04/20/2020 04/12/2020 02/23/2020  Height 6\' 0"  6\' 4"  6\' 4"   Weight 285 lbs 11 oz 282 lbs 10 oz 283 lbs 6 oz  BMI 38.74 84.53 64.68  Systolic 032 122 482  Diastolic 88 72 70  Pulse 68  72 81     Physical Exam  She looks systemically well.  She remains obese.  Blood pressure slightly elevated today.     Assessment   1. Anemia, unspecified type   2. Urinary tract infection without hematuria, site unspecified   3. Malaise and fatigue   4. Hair loss   5. Cold intolerance       Tests ordered Orders Placed This Encounter  Procedures  . Urinalysis w microscopic + reflex cultur     Plan: 1. We will send the urine for urinalysis and possible culture to see if she has a urine infection. 2. She will continue to take her iron supplementations as a result of bariatric surgery and we will repeat blood work the next time. 3. I I believe that she does have symptoms of thyroid hypofunction and I am going to start her on desiccated NP thyroid, off label for  the symptoms.  I warned her of possible side effects. 4. Follow-up in about 6 weeks to see how she is doing.   Meds ordered this encounter  Medications  . NP THYROID 30 MG tablet    Sig: Take 1 tablet (30 mg total) by mouth daily before breakfast.    Dispense:  30 tablet    Refill:  3    Jacquez Sheetz Luther Parody, MD

## 2020-04-21 LAB — URINALYSIS W MICROSCOPIC + REFLEX CULTURE
Bilirubin Urine: NEGATIVE
Glucose, UA: NEGATIVE
Hgb urine dipstick: NEGATIVE
Hyaline Cast: NONE SEEN /LPF
Ketones, ur: NEGATIVE
Leukocyte Esterase: NEGATIVE
Nitrites, Initial: NEGATIVE
Protein, ur: NEGATIVE
RBC / HPF: NONE SEEN /HPF (ref 0–2)
Specific Gravity, Urine: 1.032 (ref 1.001–1.03)
WBC, UA: NONE SEEN /HPF (ref 0–5)
pH: 5.5 (ref 5.0–8.0)

## 2020-04-21 LAB — NO CULTURE INDICATED

## 2020-05-26 ENCOUNTER — Ambulatory Visit (INDEPENDENT_AMBULATORY_CARE_PROVIDER_SITE_OTHER): Payer: BC Managed Care – PPO | Admitting: Internal Medicine

## 2020-06-15 ENCOUNTER — Ambulatory Visit (INDEPENDENT_AMBULATORY_CARE_PROVIDER_SITE_OTHER): Payer: BC Managed Care – PPO | Admitting: Internal Medicine

## 2020-06-15 ENCOUNTER — Encounter (INDEPENDENT_AMBULATORY_CARE_PROVIDER_SITE_OTHER): Payer: Self-pay | Admitting: Internal Medicine

## 2020-06-15 ENCOUNTER — Other Ambulatory Visit: Payer: Self-pay

## 2020-06-15 VITALS — BP 112/78 | HR 74 | Temp 97.3°F | Ht 72.0 in | Wt 285.2 lb

## 2020-06-15 DIAGNOSIS — L659 Nonscarring hair loss, unspecified: Secondary | ICD-10-CM | POA: Diagnosis not present

## 2020-06-15 DIAGNOSIS — R6889 Other general symptoms and signs: Secondary | ICD-10-CM

## 2020-06-15 DIAGNOSIS — R5381 Other malaise: Secondary | ICD-10-CM | POA: Diagnosis not present

## 2020-06-15 DIAGNOSIS — E669 Obesity, unspecified: Secondary | ICD-10-CM

## 2020-06-15 DIAGNOSIS — E559 Vitamin D deficiency, unspecified: Secondary | ICD-10-CM

## 2020-06-15 DIAGNOSIS — E119 Type 2 diabetes mellitus without complications: Secondary | ICD-10-CM

## 2020-06-15 DIAGNOSIS — R5383 Other fatigue: Secondary | ICD-10-CM

## 2020-06-15 DIAGNOSIS — D649 Anemia, unspecified: Secondary | ICD-10-CM | POA: Diagnosis not present

## 2020-06-15 MED ORDER — ALBUTEROL SULFATE HFA 108 (90 BASE) MCG/ACT IN AERS: 2.0000 | INHALATION_SPRAY | RESPIRATORY_TRACT | 6 refills | Status: AC | PRN

## 2020-06-15 NOTE — Progress Notes (Signed)
Metrics: Intervention Frequency ACO  Documented Smoking Status Yearly  Screened one or more times in 24 months  Cessation Counseling or  Active cessation medication Past 24 months  Past 24 months   Guideline developer: UpToDate (See UpToDate for funding source) Date Released: 2014       Wellness Office Visit  Subjective:  Patient ID: Marie Spears, female    DOB: Sep 09, 1975  Age: 44 y.o. MRN: [id number]  CC: This lady comes in for follow-up of anemia, diabetes, obesity, vitamin D deficiency and symptoms of thyroid hypofunction. HPI  She is feeling much better.  She feels that she has more energy, the cold intolerance is somewhat improving, her hair is growing back.  She was found to be anemic with a normocytic anemia.  She has had bariatric surgery previously. She is exercising 5 days a week and trying her best with nutrition now. Past Medical History:  Diagnosis Date  . Asthma   . Bronchitis   . Diabetes mellitus without complication ([facility])   . DVT (deep venous thrombosis) ([facility])   . Obesity    Past Surgical History:  Procedure Laterality Date  . BARIATRIC SURGERY  06/23/2019   [facility] 347lbs prior to surgery.  Marland Kitchen. HERNIA REPAIR       Family History  Problem Relation Age of Onset  . Heart disease Mother   . Diabetes Mother   . Hypertension Father   . Diabetes Sister   . 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   Social History   Tobacco Use  . Smoking status: Never Smoker  . Smokeless tobacco: Never Used  Substance Use Topics  . Alcohol use: No    Current Meds  Medication Sig  . Cholecalciferol (VITAMIN D-3) 125 MCG (5000 UT) TABS Take 2 tablets by mouth daily.  . Multiple Vitamins-Minerals (BARIATRIC MULTIVITAMINS/IRON PO) Take 45 mg by mouth daily.  . NP THYROID 30 MG tablet Take 1 tablet (30 mg total) by mouth  daily before breakfast.  . [DISCONTINUED] albuterol (PROVENTIL HFA;VENTOLIN HFA) 108 (90 Base) MCG/ACT inhaler Inhale 2 puffs into the lungs every 4 (four) hours as needed for wheezing or shortness of breath.      [facility] 06[facility]15/2020 02/23/2020  Decreased Interest 0 0  Down, Depressed, Hopeless 1 0  PHQ - 2 Score 1 0  Altered sleeping 1 -  Tired, decreased energy 1 -  Change in appetite 1 -  Feeling bad or failure about yourself  0 -  Trouble concentrating 1 -  Moving slowly or fidgety/restless 0 -  Suicidal thoughts 0 -  PHQ-9 Score 5 -  Difficult doing work/chores Not difficult at all -     Objective:   Today's Vitals: BP 112/78   Pulse 74   Temp (!) 97.3 F (36.3 C) (Temporal)   Ht 6' (1.829 m)   Wt 285 lb 3.2 oz (129.4 kg)   SpO2 98%   BMI 38.68 kg/m  Vitals with BMI 06/15/2020 04/20/2020 04/12/2020  Height 6\' 0"  6\' 0"  6\' 4"   Weight 285 lbs 3 oz 285 lbs 11 oz 282 lbs 10 oz  BMI 38.67 38.74 34.41  Systolic 112 136 [id number]  Diastolic 78 88 72  Pulse 74 68 72     Physical Exam   Although she remains obese, she has not lost any weight but she does  feel much better.   Assessment   1. Anemia, unspecified type   2. Malaise and fatigue   3. Hair loss   4. Cold intolerance   5. Diabetes mellitus without complication ([facility])   6. Obesity (BMI 30-39.9)   7. Vitamin D deficiency disease       Tests ordered Orders Placed This Encounter  Procedures  . Iron,Total/Total Iron Binding Cap  . B12 and Folate Panel  . Ferritin  . VITAMIN D 25 Hydroxy (Vit-D Deficiency, Fractures)  . COMPLETE METABOLIC PANEL WITH GFR     Plan: 1. We will investigate the anemia further with hematinics. 2. She will continue with vitamin D3 10,000 units daily I will check vitamin D levels today. 3. I recommended that she increase the dose of thyroid to NP thyroid 60 mg daily and she will take 2 tablets of the NP thyroid 30 mg tablets.  If she tolerates this, I will  send her on NP thyroid 60 mg tablet, take 1 daily. 4. Follow-up in 6 weeks.   Meds ordered this encounter  Medications  . albuterol (VENTOLIN HFA) 108 (90 Base) MCG/ACT inhaler    Sig: Inhale 2 puffs into the lungs every 4 (four) hours as needed for wheezing or shortness of breath.    Dispense:  1 each    Refill:  6    Iyan Flett Luther Parody, MD

## 2020-06-16 ENCOUNTER — Other Ambulatory Visit (INDEPENDENT_AMBULATORY_CARE_PROVIDER_SITE_OTHER): Payer: Self-pay | Admitting: Internal Medicine

## 2020-06-16 DIAGNOSIS — D649 Anemia, unspecified: Secondary | ICD-10-CM

## 2020-06-16 LAB — COMPLETE METABOLIC PANEL WITH GFR
AG Ratio: 1.3 (calc) (ref 1.0–2.5)
ALT: 17 U/L (ref 6–29)
AST: 25 U/L (ref 10–35)
Albumin: 3.9 g/dL (ref 3.6–5.1)
Alkaline phosphatase (APISO): 63 U/L (ref 31–125)
BUN: 13 mg/dL (ref 7–25)
CO2: 27 mmol/L (ref 20–32)
Calcium: 9.3 mg/dL (ref 8.6–10.2)
Chloride: 106 mmol/L (ref 98–110)
Creat: 0.9 mg/dL (ref 0.50–1.10)
GFR, Est African American: 89 mL/min/{1.73_m2} (ref >=60)
GFR, Est Non African American: 77 mL/min/{1.73_m2} (ref >=60)
Globulin: 3 g/dL (calc) (ref 1.9–3.7)
Glucose, Bld: 102 mg/dL (ref 65–139)
Potassium: 4 mmol/L (ref 3.5–5.3)
Sodium: 141 mmol/L (ref 135–146)
Total Bilirubin: 0.7 mg/dL (ref 0.2–1.2)
Total Protein: 6.9 g/dL (ref 6.1–8.1)

## 2020-06-16 LAB — B12 AND FOLATE PANEL
Folate: >24 ng/mL
Vitamin B-12: 1221 pg/mL — ABNORMAL HIGH (ref 200–1100)

## 2020-06-16 LAB — FERRITIN: Ferritin: 17 ng/mL (ref 16–232)

## 2020-06-16 LAB — IRON, TOTAL/TOTAL IRON BINDING CAP
%SAT: 15 % (calc) — ABNORMAL LOW (ref 16–45)
Iron: 46 ug/dL (ref 40–190)
TIBC: 303 mcg/dL (calc) (ref 250–450)

## 2020-06-16 LAB — VITAMIN D 25 HYDROXY (VIT D DEFICIENCY, FRACTURES): Vit D, 25-Hydroxy: 52 ng/mL (ref 30–100)

## 2020-06-23 NOTE — Progress Notes (Deleted)
Referring Provider: Wilson Singer, MD Primary Care Physician:  Wilson Singer, MD Primary Gastroenterologist:  Dr. Bonnetta Barry chief complaint on file.   HPI:   Marie Spears is a 44 y.o. female presenting today at the request of Wilson Singer, MD for anemia.  History of gastric sleeve surgery 06/23/2019 with [facility].  Reviewed recent labs.  Hemoglobin 11.6 on 04/12/2020.  This is down from 13.0 on 06/29/2019. 06/15/2020: Iron 46, saturation 15% (L), vitamin B12 1221, folate >24.  Due to component of iron deficiency, PCP recommended GI evaluation.    Past Medical History:  Diagnosis Date  . Asthma   . Bronchitis   . Diabetes mellitus without complication ([facility])   . DVT (deep venous thrombosis) ([facility])   . Obesity     Past Surgical History:  Procedure Laterality Date  . BARIATRIC SURGERY  06/23/2019   [facility] 347lbs prior to surgery.  Marland Kitchen HERNIA REPAIR      Current Outpatient Medications  Medication Sig Dispense Refill  . albuterol (VENTOLIN HFA) 108 (90 Base) MCG/ACT inhaler Inhale 2 puffs into the lungs every 4 (four) hours as needed for wheezing or shortness of breath. 1 each 6  . calcium citrate-vitamin D (CITRACAL+D) 315-200 MG-UNIT tablet Take by mouth. (Patient not taking: Reported on 06/15/2020)    . Cholecalciferol (VITAMIN D-3) 125 MCG (5000 UT) TABS Take 2 tablets by mouth daily.    . Multiple Vitamins-Minerals (BARIATRIC MULTIVITAMINS/IRON PO) Take 45 mg by mouth daily.    . NP THYROID 30 MG tablet Take 1 tablet (30 mg total) by mouth daily before breakfast. 30 tablet 3  . Omega-3 Fatty Acids (FISH OIL BURP-LESS) 1000 MG CAPS Take 3,000 mg by mouth as needed.  (Patient not taking: Reported on 06/15/2020)    . valACYclovir (VALTREX) 1000 MG tablet valacyclovir 1 gram tablet  TAKE 1 TABLET BY MOUTH THREE TIMES DAILY (Patient not taking: Reported on 06/15/2020)     No current facility-administered medications for this visit.    Allergies as  of 06/24/2020 - Review Complete 06/15/2020  Allergen Reaction Noted  . Penicillins Itching and Rash 09/25/2013  . Other Cough 03/11/2015  . Lisinopril Other (See Comments) 06/25/2019    Family History  Problem Relation Age of Onset  . Heart disease Mother   . Diabetes Mother   . Hypertension Father   . Diabetes Sister   . Diabetes Brother     Social History   Socioeconomic History  . Marital status: Divorced    Spouse name: Not on file  . Number of children: Not on file  . Years of education: Not on file  . Highest education level: Not on file  Occupational History  . Not on file  Tobacco Use  . Smoking status: Never Smoker  . Smokeless tobacco: Never Used  Vaping Use  . Vaping Use: Never used  Substance and Sexual Activity  . Alcohol use: No  . Drug use: No  . Sexual activity: Not on file  Other Topics Concern  . 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   Social Determinants of Health   Financial Resource Strain: Not on file  Food Insecurity: Not on file  Transportation Needs: Not on file  Physical Activity: Not on file  Stress: Not on file  Social Connections: Not on file  Intimate Partner Violence: Not on file  Review of Systems: Gen: Denies any fever, chills, fatigue, weight loss, lack of appetite.  CV: Denies chest pain, heart palpitations, peripheral edema, syncope.  Resp: Denies shortness of breath at rest or with exertion. Denies wheezing or cough.  GI: Denies dysphagia or odynophagia. Denies jaundice, hematemesis, fecal incontinence. GU : Denies urinary burning, urinary frequency, urinary hesitancy MS: Denies joint pain, muscle weakness, cramps, or limitation of movement.  Derm: Denies rash, itching, dry skin Psych: Denies depression, anxiety, memory loss, and confusion Heme: Denies bruising, bleeding, and enlarged lymph  nodes.  Physical Exam: There were no vitals taken for this visit. General:   Alert and oriented. Pleasant and cooperative. Well-nourished and well-developed.  Head:  Normocephalic and atraumatic. Eyes:  Without icterus, sclera clear and conjunctiva pink.  Ears:  Normal auditory acuity. Nose:  No deformity, discharge,  or lesions. Mouth:  No deformity or lesions, oral mucosa pink.  Neck:  Supple, without mass or thyromegaly. Lungs:  Clear to auscultation bilaterally. No wheezes, rales, or rhonchi. No distress.  Heart:  S1, S2 present without murmurs appreciated.  Abdomen:  +BS, soft, non-tender and non-distended. No HSM noted. No guarding or rebound. No masses appreciated.  Rectal:  Deferred  Msk:  Symmetrical without gross deformities. Normal posture. Pulses:  Normal pulses noted. Extremities:  Without clubbing or edema. Neurologic:  Alert and  oriented x4;  grossly normal neurologically. Skin:  Intact without significant lesions or rashes. Cervical Nodes:  No significant cervical adenopathy. Psych:  Alert and cooperative. Normal mood and affect.

## 2020-06-24 ENCOUNTER — Ambulatory Visit: Payer: BC Managed Care – PPO | Admitting: Gastroenterology

## 2020-07-19 ENCOUNTER — Ambulatory Visit (INDEPENDENT_AMBULATORY_CARE_PROVIDER_SITE_OTHER): Payer: BC Managed Care – PPO | Admitting: Internal Medicine

## 2020-07-21 ENCOUNTER — Encounter: Payer: Self-pay | Admitting: Internal Medicine

## 2020-07-21 ENCOUNTER — Other Ambulatory Visit: Payer: Self-pay

## 2020-07-21 ENCOUNTER — Encounter: Payer: Self-pay | Admitting: *Deleted

## 2020-07-21 ENCOUNTER — Ambulatory Visit (INDEPENDENT_AMBULATORY_CARE_PROVIDER_SITE_OTHER): Payer: BC Managed Care – PPO | Admitting: Internal Medicine

## 2020-07-21 DIAGNOSIS — R1319 Other dysphagia: Secondary | ICD-10-CM

## 2020-07-21 DIAGNOSIS — R1310 Dysphagia, unspecified: Secondary | ICD-10-CM | POA: Insufficient documentation

## 2020-07-21 DIAGNOSIS — K219 Gastro-esophageal reflux disease without esophagitis: Secondary | ICD-10-CM

## 2020-07-21 DIAGNOSIS — D649 Anemia, unspecified: Secondary | ICD-10-CM

## 2020-07-21 NOTE — Progress Notes (Signed)
  Primary Care Physician:  Gosrani, Nimish C, MD Primary Gastroenterologist:  Dr. Oryan Winterton  Chief Complaint  Patient presents with  . Anemia  . Abdominal Pain    Burning and cramping     HPI:   Marie Spears is a 45 y.o. female who presents to the clinic today by referral from her PCP Dr. Gosrani for evaluation for anemia.  Patient with worsening normocytic anemia.  Hemoglobin has dropped from 13 to11.6 most recently.  Iron studies unremarkable.  Patient denies any gross hematochezia or melena.  Does note some abdominal cramping.  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  Does note chronic acid reflux as well as occasional dysphagia primarily with solids.  No family history of colorectal malignancy.  No unintentional weight loss.  Otherwise no other complaints.  Past Medical History:  Diagnosis Date  . Asthma   . Bronchitis   . Diabetes mellitus without complication ([facility])   . DVT (deep venous thrombosis) ([facility])   . Obesity     Past Surgical History:  Procedure Laterality Date  . BARIATRIC SURGERY  06/23/2019   [facility] 347lbs prior to surgery.  . HERNIA REPAIR      Current Outpatient Medications  Medication Sig Dispense Refill  . albuterol (VENTOLIN HFA) 108 (90 Base) MCG/ACT inhaler Inhale 2 puffs into the lungs every 4 (four) hours as needed for wheezing or shortness of breath. 1 each 6  . Cholecalciferol (VITAMIN D-3) 125 MCG (5000 UT) TABS Take 2 tablets by mouth daily.    . Multiple Vitamins-Minerals (BARIATRIC MULTIVITAMINS/IRON PO) Take 45 mg by mouth daily.    . NP THYROID 30 MG tablet Take 1 tablet (30 mg total) by mouth daily before breakfast. (Patient taking differently: Take 60 mg by mouth daily before breakfast.) 30 tablet 3  . calcium citrate-vitamin D (CITRACAL+D) 315-200 MG-UNIT tablet Take by mouth. (Patient not taking: No sig reported)    . Omega-3 Fatty Acids (FISH OIL BURP-LESS) 1000 MG CAPS Take 3,000 mg by mouth as needed.  (Patient not taking:  No sig reported)    . valACYclovir (VALTREX) 1000 MG tablet valacyclovir 1 gram tablet  TAKE 1 TABLET BY MOUTH THREE TIMES DAILY (Patient not taking: No sig reported)     No current facility-administered medications for this visit.    Allergies as of 07/21/2020 - Review Complete 07/21/2020  Allergen Reaction Noted  . Penicillins Itching and Rash 09/25/2013  . Other Cough 03/11/2015  . Lisinopril Other (See Comments) 06/25/2019    Family History  Problem Relation Age of Onset  . Heart disease Mother   . Diabetes Mother   . Hypertension Father   . Diabetes Sister   . Diabetes Brother     Social History   Socioeconomic History  . Marital status: Divorced    Spouse name: Not on file  . Number of children: Not on file  . Years of education: Not on file  . Highest education level: Not on file  Occupational History  . Not on file  Tobacco Use  . Smoking status: Never Smoker  . Smokeless tobacco: Never Used  Vaping Use  . Vaping Use: Never used  Substance and Sexual Activity  . Alcohol use: No  . Drug use: No  . Sexual activity: Not on file  Other Topics Concern  . 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     Social Determinants of Health   Financial Resource Strain: Not on file  Food Insecurity: Not on file  Transportation Needs: Not on file  Physical Activity: Not on file  Stress: Not on file  Social Connections: Not on file  Intimate Partner Violence: Not on file    Subjective: Review of Systems  Constitutional: Negative for chills and fever.  HENT: Negative for congestion and hearing loss.   Eyes: Negative for blurred vision and double vision.  Respiratory: Negative for cough and shortness of breath.   Cardiovascular: Negative for chest pain and palpitations.  Gastrointestinal: Positive for heartburn. Negative for abdominal pain, blood in stool,  constipation, diarrhea, melena and vomiting.       Dysphagia   Genitourinary: Negative for dysuria and urgency.  Musculoskeletal: Negative for joint pain and myalgias.  Skin: Negative for itching and rash.  Neurological: Negative for dizziness and headaches.  Psychiatric/Behavioral: Negative for depression. The patient is not nervous/anxious.        Objective: BP (!) 148/75   Pulse 71   Temp (!) 97.3 F (36.3 C) (Temporal)   Ht 6' (1.829 m)   Wt 300 lb (136.1 kg)   LMP 07/21/2020   BMI 40.69 kg/m  Physical Exam Constitutional:      Appearance: Normal appearance.  HENT:     Head: Normocephalic and atraumatic.  Eyes:     Extraocular Movements: Extraocular movements intact.     Conjunctiva/sclera: Conjunctivae normal.  Cardiovascular:     Rate and Rhythm: Normal rate and regular rhythm.     Heart sounds: Murmur heard.    Pulmonary:     Effort: Pulmonary effort is normal.     Breath sounds: Normal breath sounds.  Abdominal:     General: Bowel sounds are normal.     Palpations: Abdomen is soft.  Musculoskeletal:        General: No swelling. Normal range of motion.     Cervical back: Normal range of motion and neck supple.  Skin:    General: Skin is warm and dry.     Coloration: Skin is not jaundiced.  Neurological:     General: No focal deficit present.     Mental Status: She is alert and oriented to person, place, and time.  Psychiatric:        Mood and Affect: Mood normal.        Behavior: Behavior normal.      Assessment: *Acid reflux *Dysphagia *Normocytic anemia *Abdominal cramping  Plan: Etiology of patient's anemia unclear. We will further evaluate for GI occult blood loss.  Will schedule for EGD to evaluate for peptic ulcer disease, esophagitis, gastritis, H. Pylori, duodenitis, or other. Will also evaluate for esophageal stricture, Schatzki's ring, esophageal web or other.   At the same time we will perform colonoscopy to rule out GI blood loss  from hemorrhoids, AVMs, diverticular, polyps, cancer, or other.  The risks including infection, bleed, or perforation as well as benefits, limitations, alternatives and imponderables have been reviewed with the patient. Potential for esophageal dilation, biopsy, etc. have also been reviewed.  Questions have been answered. All parties agreeable.  Thank you Dr. Anastasio Champion for the kind referral.  07/21/2020 8:50 AM   Disclaimer: This note was dictated with voice recognition software. Similar sounding words can inadvertently be transcribed and may not be corrected upon review.

## 2020-07-21 NOTE — H&P (View-Only) (Signed)
Primary Care Physician:  Doree Albee, MD Primary Gastroenterologist:  Dr. Abbey Chatters  Chief Complaint  Patient presents with  . Anemia  . Abdominal Pain    Burning and cramping     HPI:   Marie Spears is a 45 y.o. female who presents to the clinic today by referral from her PCP Dr. Anastasio Champion for evaluation for anemia.  Patient with worsening normocytic anemia.  Hemoglobin has dropped from 13 to11.6 most recently.  Iron studies unremarkable.  Patient denies any gross hematochezia or melena.  Does note some abdominal cramping.  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  Does note chronic acid reflux as well as occasional dysphagia primarily with solids.  No family history of colorectal malignancy.  No unintentional weight loss.  Otherwise no other complaints.  Past Medical History:  Diagnosis Date  . Asthma   . Bronchitis   . Diabetes mellitus without complication ([place])   . DVT (deep venous thrombosis) ([place])   . Obesity     Past Surgical History:  Procedure Laterality Date  . BARIATRIC SURGERY  06/23/2019   [facility] 347lbs prior to surgery.  Marland Kitchen HERNIA REPAIR      Current Outpatient Medications  Medication Sig Dispense Refill  . albuterol (VENTOLIN HFA) 108 (90 Base) MCG/ACT inhaler Inhale 2 puffs into the lungs every 4 (four) hours as needed for wheezing or shortness of breath. 1 each 6  . Cholecalciferol (VITAMIN D-3) 125 MCG (5000 UT) TABS Take 2 tablets by mouth daily.    . Multiple Vitamins-Minerals (BARIATRIC MULTIVITAMINS/IRON PO) Take 45 mg by mouth daily.    . NP THYROID 30 MG tablet Take 1 tablet (30 mg total) by mouth daily before breakfast. (Patient taking differently: Take 60 mg by mouth daily before breakfast.) 30 tablet 3  . calcium citrate-vitamin D (CITRACAL+D) 315-200 MG-UNIT tablet Take by mouth. (Patient not taking: No sig reported)    . Omega-3 Fatty Acids (FISH OIL BURP-LESS) 1000 MG CAPS Take 3,000 mg by mouth as needed.  (Patient not taking:  No sig reported)    . valACYclovir (VALTREX) 1000 MG tablet valacyclovir 1 gram tablet  TAKE 1 TABLET BY MOUTH THREE TIMES DAILY (Patient not taking: No sig reported)     No current facility-administered medications for this visit.    Allergies as of 07/21/2020 - Review Complete 07/21/2020  Allergen Reaction Noted  . Penicillins Itching and Rash 09/25/2013  . Other Cough 03/11/2015  . Lisinopril Other (See Comments) 06/25/2019    Family History  Problem Relation Age of Onset  . Heart disease Mother   . Diabetes Mother   . Hypertension Father   . Diabetes Sister   . Diabetes Brother     Social History   Socioeconomic History  . Marital status: Divorced    Spouse name: Not on file  . Number of children: Not on file  . Years of education: Not on file  . Highest education level: Not on file  Occupational History  . Not on file  Tobacco Use  . Smoking status: Never Smoker  . Smokeless tobacco: Never Used  Vaping Use  . Vaping Use: Never used  Substance and Sexual Activity  . Alcohol use: No  . Drug use: No  . Sexual activity: Not on file  Other Topics Concern  . 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  Social Determinants of Health   Financial Resource Strain: Not on file  Food Insecurity: Not on file  Transportation Needs: Not on file  Physical Activity: Not on file  Stress: Not on file  Social Connections: Not on file  Intimate Partner Violence: Not on file    Subjective: Review of Systems  Constitutional: Negative for chills and fever.  HENT: Negative for congestion and hearing loss.   Eyes: Negative for blurred vision and double vision.  Respiratory: Negative for cough and shortness of breath.   Cardiovascular: Negative for chest pain and palpitations.  Gastrointestinal: Positive for heartburn. Negative for abdominal pain, blood in stool,  constipation, diarrhea, melena and vomiting.       Dysphagia   Genitourinary: Negative for dysuria and urgency.  Musculoskeletal: Negative for joint pain and myalgias.  Skin: Negative for itching and rash.  Neurological: Negative for dizziness and headaches.  Psychiatric/Behavioral: Negative for depression. The patient is not nervous/anxious.        Objective: BP (!) 148/75   Pulse 71   Temp (!) 97.3 F (36.3 C) (Temporal)   Ht 6' (1.829 m)   Wt 300 lb (136.1 kg)   LMP 07/21/2020   BMI 40.69 kg/m  Physical Exam Constitutional:      Appearance: Normal appearance.  HENT:     Head: Normocephalic and atraumatic.  Eyes:     Extraocular Movements: Extraocular movements intact.     Conjunctiva/sclera: Conjunctivae normal.  Cardiovascular:     Rate and Rhythm: Normal rate and regular rhythm.     Heart sounds: Murmur heard.    Pulmonary:     Effort: Pulmonary effort is normal.     Breath sounds: Normal breath sounds.  Abdominal:     General: Bowel sounds are normal.     Palpations: Abdomen is soft.  Musculoskeletal:        General: No swelling. Normal range of motion.     Cervical back: Normal range of motion and neck supple.  Skin:    General: Skin is warm and dry.     Coloration: Skin is not jaundiced.  Neurological:     General: No focal deficit present.     Mental Status: She is alert and oriented to person, place, and time.  Psychiatric:        Mood and Affect: Mood normal.        Behavior: Behavior normal.      Assessment: *Acid reflux *Dysphagia *Normocytic anemia *Abdominal cramping  Plan: Etiology of patient's anemia unclear. We will further evaluate for GI occult blood loss.  Will schedule for EGD to evaluate for peptic ulcer disease, esophagitis, gastritis, H. Pylori, duodenitis, or other. Will also evaluate for esophageal stricture, Schatzki's ring, esophageal web or other.   At the same time we will perform colonoscopy to rule out GI blood loss  from hemorrhoids, AVMs, diverticular, polyps, cancer, or other.  The risks including infection, bleed, or perforation as well as benefits, limitations, alternatives and imponderables have been reviewed with the patient. Potential for esophageal dilation, biopsy, etc. have also been reviewed.  Questions have been answered. All parties agreeable.  Thank you Dr. Anastasio Champion for the kind referral.  07/21/2020 8:50 AM   Disclaimer: This note was dictated with voice recognition software. Similar sounding words can inadvertently be transcribed and may not be corrected upon review.

## 2020-07-21 NOTE — Patient Instructions (Signed)
We will schedule you for EGD and colonoscopy to further evaluate your anemia as well as acid reflux and abdominal pain/cramping.  Further recommendations to follow.  At [facility] we value your feedback. You may receive a survey about your visit today. Please share your experience as we strive to create trusting relationships with our patients to provide genuine, compassionate, quality care.  We appreciate your understanding and patience as we review any laboratory studies, imaging, and other diagnostic tests that are ordered as we care for you. Our office policy is 5 business days for review of these results, and any emergent or urgent results are addressed in a timely manner for your best interest. If you do not hear from our office in 1 week, please contact us.   We also encourage the use of MyChart, which contains your medical information for your review as well. If you are not enrolled in this feature, an access code is on this after visit summary for your convenience. Thank you for allowing [place] to be involved in your care.  It was great to see you today!  I hope you have a great rest of your winter!!    Marie Spears. Abbey Chatters, D.O. Gastroenterology and Hepatology [facility]

## 2020-07-22 ENCOUNTER — Ambulatory Visit: Payer: BC Managed Care – PPO | Admitting: Internal Medicine

## 2020-07-29 ENCOUNTER — Encounter (INDEPENDENT_AMBULATORY_CARE_PROVIDER_SITE_OTHER): Payer: Self-pay | Admitting: Internal Medicine

## 2020-07-29 ENCOUNTER — Other Ambulatory Visit (INDEPENDENT_AMBULATORY_CARE_PROVIDER_SITE_OTHER): Payer: Self-pay | Admitting: Internal Medicine

## 2020-07-29 MED ORDER — NP THYROID 60 MG PO TABS: 60.0000 mg | ORAL_TABLET | Freq: Every day | ORAL | 3 refills | Status: DC

## 2020-07-31 ENCOUNTER — Other Ambulatory Visit (INDEPENDENT_AMBULATORY_CARE_PROVIDER_SITE_OTHER): Payer: Self-pay | Admitting: Internal Medicine

## 2020-08-04 ENCOUNTER — Ambulatory Visit: Payer: BC Managed Care – PPO | Admitting: Gastroenterology

## 2020-08-13 ENCOUNTER — Other Ambulatory Visit (HOSPITAL_COMMUNITY)
Admission: RE | Admit: 2020-08-13 | Discharge: 2020-08-13 | Disposition: A | Discharge status: Home / Self Care | Payer: BC Managed Care – PPO | Source: Ambulatory Visit | Attending: Internal Medicine | Admitting: Internal Medicine

## 2020-08-13 ENCOUNTER — Other Ambulatory Visit (HOSPITAL_COMMUNITY): Payer: BC Managed Care – PPO

## 2020-08-13 ENCOUNTER — Other Ambulatory Visit: Payer: Self-pay

## 2020-08-13 DIAGNOSIS — R1310 Dysphagia, unspecified: Secondary | ICD-10-CM | POA: Diagnosis not present

## 2020-08-13 DIAGNOSIS — K2970 Gastritis, unspecified, without bleeding: Secondary | ICD-10-CM | POA: Diagnosis not present

## 2020-08-13 DIAGNOSIS — K3189 Other diseases of stomach and duodenum: Secondary | ICD-10-CM | POA: Diagnosis not present

## 2020-08-13 DIAGNOSIS — D509 Iron deficiency anemia, unspecified: Secondary | ICD-10-CM | POA: Diagnosis not present

## 2020-08-13 DIAGNOSIS — K2100 Gastro-esophageal reflux disease with esophagitis, without bleeding: Secondary | ICD-10-CM | POA: Diagnosis not present

## 2020-08-13 DIAGNOSIS — D120 Benign neoplasm of cecum: Secondary | ICD-10-CM | POA: Diagnosis not present

## 2020-08-13 DIAGNOSIS — Z20822 Contact with and (suspected) exposure to covid-19: Secondary | ICD-10-CM | POA: Insufficient documentation

## 2020-08-13 DIAGNOSIS — Z888 Allergy status to other drugs, medicaments and biological substances status: Secondary | ICD-10-CM | POA: Diagnosis not present

## 2020-08-13 DIAGNOSIS — K449 Diaphragmatic hernia without obstruction or gangrene: Secondary | ICD-10-CM | POA: Diagnosis not present

## 2020-08-13 DIAGNOSIS — Z01812 Encounter for preprocedural laboratory examination: Secondary | ICD-10-CM | POA: Insufficient documentation

## 2020-08-13 DIAGNOSIS — K648 Other hemorrhoids: Secondary | ICD-10-CM | POA: Diagnosis not present

## 2020-08-13 DIAGNOSIS — K5730 Diverticulosis of large intestine without perforation or abscess without bleeding: Secondary | ICD-10-CM | POA: Diagnosis not present

## 2020-08-13 DIAGNOSIS — Z880 Allergy status to penicillin: Secondary | ICD-10-CM | POA: Diagnosis not present

## 2020-08-13 DIAGNOSIS — Z9884 Bariatric surgery status: Secondary | ICD-10-CM | POA: Diagnosis not present

## 2020-08-13 DIAGNOSIS — Z79890 Hormone replacement therapy (postmenopausal): Secondary | ICD-10-CM | POA: Diagnosis not present

## 2020-08-13 LAB — SARS CORONAVIRUS 2 (TAT 6-24 HRS): SARS Coronavirus 2: NEGATIVE

## 2020-08-16 ENCOUNTER — Encounter (HOSPITAL_COMMUNITY)
Admission: RE | Disposition: A | Discharge status: Home / Self Care | Payer: Self-pay | Source: Home / Self Care | Attending: Internal Medicine

## 2020-08-16 ENCOUNTER — Ambulatory Visit (HOSPITAL_COMMUNITY): Payer: BC Managed Care – PPO | Admitting: Certified Registered"

## 2020-08-16 ENCOUNTER — Ambulatory Visit (HOSPITAL_COMMUNITY)
Admission: RE | Admit: 2020-08-16 | Discharge: 2020-08-16 | Disposition: A | Discharge status: Home / Self Care | Payer: BC Managed Care – PPO | Attending: Internal Medicine | Admitting: Internal Medicine

## 2020-08-16 ENCOUNTER — Encounter (HOSPITAL_COMMUNITY): Payer: Self-pay | Admitting: Internal Medicine

## 2020-08-16 ENCOUNTER — Other Ambulatory Visit: Payer: Self-pay

## 2020-08-16 DIAGNOSIS — K635 Polyp of colon: Secondary | ICD-10-CM

## 2020-08-16 DIAGNOSIS — D120 Benign neoplasm of cecum: Secondary | ICD-10-CM | POA: Insufficient documentation

## 2020-08-16 DIAGNOSIS — R1310 Dysphagia, unspecified: Secondary | ICD-10-CM | POA: Diagnosis not present

## 2020-08-16 DIAGNOSIS — K449 Diaphragmatic hernia without obstruction or gangrene: Secondary | ICD-10-CM | POA: Insufficient documentation

## 2020-08-16 DIAGNOSIS — D509 Iron deficiency anemia, unspecified: Secondary | ICD-10-CM | POA: Insufficient documentation

## 2020-08-16 DIAGNOSIS — K3189 Other diseases of stomach and duodenum: Secondary | ICD-10-CM | POA: Diagnosis not present

## 2020-08-16 DIAGNOSIS — Z79890 Hormone replacement therapy (postmenopausal): Secondary | ICD-10-CM | POA: Insufficient documentation

## 2020-08-16 DIAGNOSIS — K2100 Gastro-esophageal reflux disease with esophagitis, without bleeding: Secondary | ICD-10-CM

## 2020-08-16 DIAGNOSIS — Z20822 Contact with and (suspected) exposure to covid-19: Secondary | ICD-10-CM | POA: Insufficient documentation

## 2020-08-16 DIAGNOSIS — K648 Other hemorrhoids: Secondary | ICD-10-CM | POA: Insufficient documentation

## 2020-08-16 DIAGNOSIS — Z888 Allergy status to other drugs, medicaments and biological substances status: Secondary | ICD-10-CM | POA: Insufficient documentation

## 2020-08-16 DIAGNOSIS — K2970 Gastritis, unspecified, without bleeding: Secondary | ICD-10-CM | POA: Insufficient documentation

## 2020-08-16 DIAGNOSIS — Z880 Allergy status to penicillin: Secondary | ICD-10-CM | POA: Insufficient documentation

## 2020-08-16 DIAGNOSIS — Z9884 Bariatric surgery status: Secondary | ICD-10-CM | POA: Insufficient documentation

## 2020-08-16 DIAGNOSIS — K5730 Diverticulosis of large intestine without perforation or abscess without bleeding: Secondary | ICD-10-CM | POA: Insufficient documentation

## 2020-08-16 HISTORY — PX: POLYPECTOMY: SHX5525

## 2020-08-16 HISTORY — PX: BIOPSY: SHX5522

## 2020-08-16 HISTORY — PX: ESOPHAGOGASTRODUODENOSCOPY (EGD) WITH PROPOFOL: SHX5813

## 2020-08-16 HISTORY — PX: COLONOSCOPY WITH PROPOFOL: SHX5780

## 2020-08-16 LAB — PREGNANCY, URINE: Preg Test, Ur: NEGATIVE

## 2020-08-16 LAB — GLUCOSE, CAPILLARY: Glucose-Capillary: 82 mg/dL (ref 70–99)

## 2020-08-16 SURGERY — ESOPHAGOGASTRODUODENOSCOPY (EGD) WITH PROPOFOL
Anesthesia: General

## 2020-08-16 MED ORDER — LACTATED RINGERS IV SOLN: INTRAVENOUS | Status: DC | PRN

## 2020-08-16 MED ORDER — OMEPRAZOLE 20 MG PO CPDR: 20.0000 mg | DELAYED_RELEASE_CAPSULE | Freq: Two times a day (BID) | ORAL | 5 refills | Status: DC

## 2020-08-16 MED ORDER — LIDOCAINE HCL (CARDIAC) PF 100 MG/5ML IV SOSY
PREFILLED_SYRINGE | INTRAVENOUS | Status: DC | PRN
  Administered 2020-08-16: 50 mg via INTRAVENOUS

## 2020-08-16 MED ORDER — PROPOFOL 10 MG/ML IV BOLUS
INTRAVENOUS | Status: DC | PRN
  Administered 2020-08-16: 40 mg via INTRAVENOUS
  Administered 2020-08-16: 100 mg via INTRAVENOUS

## 2020-08-16 MED ORDER — PROPOFOL 500 MG/50ML IV EMUL
INTRAVENOUS | Status: DC | PRN
  Administered 2020-08-16: 150 ug/kg/min via INTRAVENOUS

## 2020-08-16 MED ORDER — STERILE WATER FOR IRRIGATION IR SOLN
Status: DC | PRN
  Administered 2020-08-16: 1.5 mL

## 2020-08-16 NOTE — Discharge Instructions (Addendum)
EGD Discharge instructions Please read the instructions outlined below and refer to this sheet in the next few weeks. These discharge instructions provide you with general information on caring for yourself after you leave the hospital. Your doctor may also give you specific instructions. While your treatment has been planned according to the most current medical practices available, unavoidable complications occasionally occur. If you have any problems or questions after discharge, please call your doctor. ACTIVITY  You may resume your regular activity but move at a slower pace for the next 24 hours.   Take frequent rest periods for the next 24 hours.   Walking will help expel (get rid of) the air and reduce the bloated feeling in your abdomen.   No driving for 24 hours (because of the anesthesia (medicine) used during the test).   You may shower.   Do not sign any important legal documents or operate any machinery for 24 hours (because of the anesthesia used during the test).  NUTRITION  Drink plenty of fluids.   You may resume your normal diet.   Begin with a light meal and progress to your normal diet.   Avoid alcoholic beverages for 24 hours or as instructed by your caregiver.  MEDICATIONS  You may resume your normal medications unless your caregiver tells you otherwise.  WHAT YOU CAN EXPECT TODAY  You may experience abdominal discomfort such as a feeling of fullness or "gas" pains.  FOLLOW-UP  Your doctor will discuss the results of your test with you.  SEEK IMMEDIATE MEDICAL ATTENTION IF ANY OF THE FOLLOWING OCCUR:  Excessive nausea (feeling sick to your stomach) and/or vomiting.   Severe abdominal pain and distention (swelling).   Trouble swallowing.   Temperature over 101 F (37.8 C).   Rectal bleeding or vomiting of blood.     Colonoscopy Discharge Instructions  Read the instructions outlined below and refer to this sheet in the next few weeks. These  discharge instructions provide you with general information on caring for yourself after you leave the hospital. Your doctor may also give you specific instructions. While your treatment has been planned according to the most current medical practices available, unavoidable complications occasionally occur.   ACTIVITY  You may resume your regular activity, but move at a slower pace for the next 24 hours.   Take frequent rest periods for the next 24 hours.   Walking will help get rid of the air and reduce the bloated feeling in your belly (abdomen).   No driving for 24 hours (because of the medicine (anesthesia) used during the test).    Do not sign any important legal documents or operate any machinery for 24 hours (because of the anesthesia used during the test).  NUTRITION  Drink plenty of fluids.   You may resume your normal diet as instructed by your doctor.   Begin with a light meal and progress to your normal diet. Heavy or fried foods are harder to digest and may make you feel sick to your stomach (nauseated).   Avoid alcoholic beverages for 24 hours or as instructed.  MEDICATIONS  You may resume your normal medications unless your doctor tells you otherwise.  WHAT YOU CAN EXPECT TODAY  Some feelings of bloating in the abdomen.   Passage of more gas than usual.   Spotting of blood in your stool or on the toilet paper.  IF YOU HAD POLYPS REMOVED DURING THE COLONOSCOPY:  No aspirin products for 7 days or as instructed.  No alcohol for 7 days or as instructed.   Eat a soft diet for the next 24 hours.  FINDING OUT THE RESULTS OF YOUR TEST Not all test results are available during your visit. If your test results are not back during the visit, make an appointment with your caregiver to find out the results. Do not assume everything is normal if you have not heard from your caregiver or the medical facility. It is important for you to follow up on all of your test results.   SEEK IMMEDIATE MEDICAL ATTENTION IF:  You have more than a spotting of blood in your stool.   Your belly is swollen (abdominal distention).   You are nauseated or vomiting.   You have a temperature over 101.   You have abdominal pain or discomfort that is severe or gets worse throughout the day.   Your EGD showed small hiatal hernia as well as inflammation in your esophagus and stomach.  I took biopsies to rule out infection with a bacteria called H. pylori.  Also looks like you had early forming ulcer in your small bowel which I also biopsied.  I am going to start you on omeprazole 20 mg twice daily.  I want you to take this 30 minutes before breakfast and 30 minutes before dinner.  Your colonoscopy revealed 1 polyp(s) which I removed successfully. Await pathology results, my office will contact you. I recommend repeating colonoscopy in 5 years for surveillance purposes.   Follow up with GI in 3 months.    I hope you have a great rest of your week!  Elon Alas. Abbey Chatters, D.O. Gastroenterology and Hepatology [facility]    Colon Polyps  Colon polyps are tissue growths inside the colon, which is part of the large intestine. They are one of the types of polyps that can grow in the body. A polyp may be a round bump or a mushroom-shaped growth. You could have one polyp or more than one. Most colon polyps are noncancerous (benign). However, some colon polyps can become cancerous over time. Finding and removing the polyps early can help prevent this. What are the causes? The exact cause of colon polyps is not known. What increases the risk? The following factors may make you more likely to develop this condition:  Having a family history of colorectal cancer or colon polyps.  Being older than 47 years of age.  Being younger than 47 years of age and having a significant family history of colorectal cancer or colon polyps or a genetic condition that puts you  at higher risk of getting colon polyps.  Having inflammatory bowel disease, such as ulcerative colitis or Crohn's disease.  Having certain conditions passed from parent to child (hereditary conditions), such as: ? Familial adenomatous polyposis (FAP). ? Lynch syndrome. ? Turcot syndrome. ? Peutz-Jeghers syndrome. ? MUTYH-associated polyposis (MAP).  Being overweight.  Certain lifestyle factors. These include smoking cigarettes, drinking too much alcohol, not getting enough exercise, and eating a diet that is high in fat and red meat and low in fiber.  Having had childhood cancer that was treated with radiation of the abdomen. What are the signs or symptoms? Many times, there are no symptoms. If you have symptoms, they may include:  Blood coming from the rectum during a bowel movement.  Blood in the stool (feces). The blood may be bright red or very dark in color.  Pain in the abdomen.  A change in bowel habits, such as constipation  or diarrhea. How is this diagnosed? This condition is diagnosed with a colonoscopy. This is a procedure in which a lighted, flexible scope is inserted into the opening between the buttocks (anus) and then passed into the colon to examine the area. Polyps are sometimes found when a colonoscopy is done as part of routine cancer screening tests. How is this treated? This condition is treated by removing any polyps that are found. Most polyps can be removed during a colonoscopy. Those polyps will then be tested for cancer. Additional treatment may be needed depending on the results of testing. Follow these instructions at home: Eating and drinking  Eat foods that are high in fiber, such as fruits, vegetables, and whole grains.  Eat foods that are high in calcium and vitamin D, such as milk, cheese, yogurt, eggs, liver, fish, and broccoli.  Limit foods that are high in fat, such as fried foods and desserts.  Limit the amount of red meat, precooked or  cured meat, or other processed meat that you eat, such as hot dogs, sausages, bacon, or meat loaves.  Limit sugary drinks.   Lifestyle  Maintain a healthy weight, or lose weight if recommended by your health care provider.  Exercise every day or as told by your health care provider.  Do not use any products that contain nicotine or tobacco, such as cigarettes, e-cigarettes, and chewing tobacco. If you need help quitting, ask your health care provider.  Do not drink alcohol if: ? Your health care provider tells you not to drink. ? You are pregnant, may be pregnant, or are planning to become pregnant.  If you drink alcohol: ? Limit how much you use to:  0-1 drink a day for women.  0-2 drinks a day for men. ? Know how much alcohol is in your drink. In the [place] one drink equals one 12 oz bottle of beer (355 mL), one 5 oz glass of wine (148 mL), or one 1 oz glass of hard liquor (44 mL). General instructions  Take over-the-counter and prescription medicines only as told by your health care provider.  Keep all follow-up visits. This is important. This includes having regularly scheduled colonoscopies. Talk to your health care provider about when you need a colonoscopy. Contact a health care provider if:  You have new or worsening bleeding during a bowel movement.  You have new or increased blood in your stool.  You have a change in bowel habits.  You lose weight for no known reason. Summary  Colon polyps are tissue growths inside the colon, which is part of the large intestine. They are one type of polyp that can grow in the body.  Most colon polyps are noncancerous (benign), but some can become cancerous over time.  This condition is diagnosed with a colonoscopy.  This condition is treated by removing any polyps that are found. Most polyps can be removed during a colonoscopy. This information is not intended to replace advice given to you by your health care provider. Make  sure you discuss any questions you have with your health care provider. Document Revised: 10/01/2019 Document Reviewed: 10/01/2019 Elsevier Patient Education  2021 [employer].

## 2020-08-16 NOTE — Transfer of Care (Signed)
Immediate Anesthesia Transfer of Care Note  Patient: Marie Spears  Procedure(s) Performed: ESOPHAGOGASTRODUODENOSCOPY (EGD) WITH PROPOFOL (N/A ) COLONOSCOPY WITH PROPOFOL (N/A ) BIOPSY POLYPECTOMY  Patient Location: Endoscopy Unit  Anesthesia Type:General  Level of Consciousness: awake and alert   Airway & Oxygen Therapy: Patient Spontanous Breathing  Post-op Assessment: Report given to RN and Post -op Vital signs reviewed and stable  Post vital signs: Reviewed and stable  Last Vitals:  Vitals Value Taken Time  BP    Temp    Pulse    Resp    SpO2      Last Pain:  Vitals:   08/16/20 0829  TempSrc:   PainSc: 0-No pain         Complications: No complications documented.

## 2020-08-16 NOTE — Op Note (Signed)
[facility] Patient Name: Marie Spears Procedure Date: 08/16/2020 8:41 AM MRN: [id number] Date of Birth: [birth year] Attending MD: Elon Alas. Edgar Frisk CSN: [id number] Age: 45 Admit Type: Outpatient Procedure:                Colonoscopy Indications:              Iron deficiency anemia Providers:                Elon Alas. Abbey Chatters, DO, Caprice Kluver, Randa Spike,                            Technician Referring MD:              Medicines:                See the Anesthesia note for documentation of the                            administered medications Complications:            No immediate complications. Estimated Blood Loss:     Estimated blood loss was minimal. Procedure:                Pre-Anesthesia Assessment:                           - The anesthesia plan was to use monitored                            anesthesia care (MAC).                           After obtaining informed consent, the colonoscope                            was passed under direct vision. Throughout the                            procedure, the patient's blood pressure, pulse, and                            oxygen saturations were monitored continuously. The                            [id number]) was introduced through the anus                            and advanced to the the cecum, identified by                            appendiceal orifice and ileocecal valve. The                            colonoscopy was performed without difficulty. The                            patient tolerated the procedure well. The quality  of the bowel preparation was evaluated using the                            BBPS [facility] Bowel Preparation Scale) with scores                            of: Right Colon = 2 (minor amount of residual                            staining, small fragments of stool and/or opaque                            liquid, but mucosa seen well), Transverse Colon = 2                             (minor amount of residual staining, small fragments                            of stool and/or opaque liquid, but mucosa seen                            well) and Left Colon = 2 (minor amount of residual                            staining, small fragments of stool and/or opaque                            liquid, but mucosa seen well). The total BBPS score                            equals 6. The quality of the bowel preparation was                            fair. Scope In: 8:43:24 AM Scope Out: 8:55:00 AM Scope Withdrawal Time: 0 hours 8 minutes 10 seconds  Total Procedure Duration: 0 hours 11 minutes 36 seconds  Findings:      The perianal and digital rectal examinations were normal.      Non-bleeding internal hemorrhoids were found during endoscopy.      A few small-mouthed diverticula were found in the sigmoid colon.      A 8 mm polyp was found in the cecum. The polyp was sessile. The polyp       was removed with a cold snare. Resection and retrieval were complete.      The exam was otherwise without abnormality. Impression:               - Preparation of the colon was fair.                           - Non-bleeding internal hemorrhoids.                           - Diverticulosis in the sigmoid colon.                           -  One 8 mm polyp in the cecum, removed with a cold                            snare. Resected and retrieved.                           - The examination was otherwise normal. Moderate Sedation:      Per Anesthesia Care Recommendation:           - Patient has a contact number available for                            emergencies. The signs and symptoms of potential                            delayed complications were discussed with the                            patient. Return to normal activities tomorrow.                            Written discharge instructions were provided to the                            patient.                            - Resume previous diet.                           - Continue present medications.                           - Await pathology results.                           - Repeat colonoscopy in 5 years for surveillance.                           - Return to GI clinic in 3 months. Procedure Code(s):        --- Professional ---                           [id number], Colonoscopy, flexible; with removal of                            tumor(s), polyp(s), or other lesion(s) by snare                            technique Diagnosis Code(s):        --- Professional ---                           K64.8, Other hemorrhoids                           K63.5, Polyp of  colon                           D50.9, Iron deficiency anemia, unspecified                           K57.30, Diverticulosis of large intestine without                            perforation or abscess without bleeding CPT copyright 2019 American Medical Association. All rights reserved. The codes documented in this report are preliminary and upon coder review may  be revised to meet current compliance requirements. Elon Alas. Abbey Chatters, DO [place] Abbey Chatters, DO 08/16/2020 9:05:25 AM This report has been signed electronically. Number of Addenda: 0

## 2020-08-16 NOTE — Op Note (Signed)
[facility] Patient Name: Marie Spears Procedure Date: 08/16/2020 7:07 AM MRN: [id number] Date of Birth: 11-13-1975 Attending MD: Elon Alas. Edgar Frisk CSN: [id number] Age: 45 Admit Type: Outpatient Procedure:                Upper GI endoscopy Indications:              Iron deficiency anemia, Dysphagia Providers:                Elon Alas. Abbey Chatters, DO, Crystal Page, Randa Spike, Technician Referring MD:              Medicines:                See the Anesthesia note for documentation of the                            administered medications Complications:            No immediate complications. Estimated Blood Loss:     Estimated blood loss was minimal. Procedure:                Pre-Anesthesia Assessment:                           - The anesthesia plan was to use monitored                            anesthesia care (MAC).                           After obtaining informed consent, the endoscope was                            passed under direct vision. Throughout the                            procedure, the patient's blood pressure, pulse, and                            oxygen saturations were monitored continuously. The                            Endoscope was introduced through the mouth, and                            advanced to the second part of duodenum. The upper                            GI endoscopy was accomplished without difficulty.                            The patient tolerated the procedure well. Scope In: 8:34:41 AM Scope Out: 8:39:28 AM Total Procedure Duration: 0 hours 4 minutes 47 seconds  Findings:      A medium-sized hiatal hernia was present.  LA Grade C (one or more mucosal breaks continuous between tops of 2 or       more mucosal folds, less than 75% circumference) esophagitis with no       bleeding was found at the gastroesophageal junction.      Diffuse mild inflammation characterized by erythema was found in  the       entire examined stomach. Biopsies were taken with a cold forceps for       Helicobacter pylori testing.      Localized moderately congested mucosa without active bleeding and with       no stigmata of bleeding was found in the duodenal bulb. Possible early       ulcer. Biopsies were taken with a cold forceps for histology. Impression:               - Medium-sized hiatal hernia.                           - LA Grade C reflux esophagitis with no bleeding.                           - Gastritis. Biopsied.                           - Congested duodenal mucosa. Biopsied. Moderate Sedation:      Per Anesthesia Care Recommendation:           - Patient has a contact number available for                            emergencies. The signs and symptoms of potential                            delayed complications were discussed with the                            patient. Return to normal activities tomorrow.                            Written discharge instructions were provided to the                            patient.                           - Resume previous diet.                           - Continue present medications.                           - Await pathology results.                           - Use a proton pump inhibitor PO BID.                           - Return to GI clinic in 3 months. Procedure Code(s):        ---  Professional ---                           [phone], Esophagogastroduodenoscopy, flexible,                            transoral; with biopsy, single or multiple Diagnosis Code(s):        --- Professional ---                           K44.9, Diaphragmatic hernia without obstruction or                            gangrene                           K21.00, Gastro-esophageal reflux disease with                            esophagitis, without bleeding                           K29.70, Gastritis, unspecified, without bleeding                           K31.89, Other  diseases of stomach and duodenum                           D50.9, Iron deficiency anemia, unspecified                           R13.10, Dysphagia, unspecified CPT copyright 2019 American Medical Association. All rights reserved. The codes documented in this report are preliminary and upon coder review may  be revised to meet current compliance requirements. Elon Alas. Abbey Chatters, DO Margaret Abbey Chatters, DO 08/16/2020 9:03:44 AM This report has been signed electronically. Number of Addenda: 0

## 2020-08-16 NOTE — Anesthesia Preprocedure Evaluation (Signed)
Anesthesia Evaluation  Patient identified by MRN, date of birth, ID band Patient awake    Reviewed: Allergy & Precautions, H&P , NPO status , Patient's Chart, lab work & pertinent test results, reviewed documented beta blocker date and time   Airway Mallampati: II  TM Distance: >3 FB Neck ROM: full    Dental no notable dental hx.    Pulmonary asthma ,    Pulmonary exam normal breath sounds clear to auscultation       Cardiovascular Exercise Tolerance: Good negative cardio ROS   Rhythm:regular Rate:Normal     Neuro/Psych negative neurological ROS  negative psych ROS   GI/Hepatic Neg liver ROS, GERD  Medicated,  Endo/Other  negative endocrine ROSdiabetes  Renal/GU negative Renal ROS  negative genitourinary   Musculoskeletal   Abdominal   Peds  Hematology  (+) Blood dyscrasia, anemia ,   Anesthesia Other Findings   Reproductive/Obstetrics negative OB ROS                             Anesthesia Physical Anesthesia Plan  ASA: II  Anesthesia Plan: General   Post-op Pain Management:    Induction:   PONV Risk Score and Plan: Propofol infusion  Airway Management Planned:   Additional Equipment:   Intra-op Plan:   Post-operative Plan:   Informed Consent: I have reviewed the patients History and Physical, chart, labs and discussed the procedure including the risks, benefits and alternatives for the proposed anesthesia with the patient or authorized representative who has indicated his/her understanding and acceptance.     Dental Advisory Given  Plan Discussed with: CRNA  Anesthesia Plan Comments:         Anesthesia Quick Evaluation

## 2020-08-16 NOTE — Anesthesia Procedure Notes (Signed)
Date/Time: 08/16/2020 8:35 AM Performed by: Orlie Dakin, CRNA Pre-anesthesia Checklist: Patient identified, Emergency Drugs available, Suction available and Patient being monitored Patient Re-evaluated:Patient Re-evaluated prior to induction Oxygen Delivery Method: Nasal cannula Induction Type: IV induction Placement Confirmation: positive ETCO2

## 2020-08-16 NOTE — Anesthesia Postprocedure Evaluation (Signed)
Anesthesia Post Note  Patient: Marie Spears  Procedure(s) Performed: ESOPHAGOGASTRODUODENOSCOPY (EGD) WITH PROPOFOL (N/A ) COLONOSCOPY WITH PROPOFOL (N/A ) BIOPSY POLYPECTOMY  Patient location during evaluation: Endoscopy Anesthesia Type: General Level of consciousness: awake and alert and oriented Pain management: pain level controlled Vital Signs Assessment: post-procedure vital signs reviewed and stable Respiratory status: spontaneous breathing, nonlabored ventilation and respiratory function stable Cardiovascular status: blood pressure returned to baseline and stable Postop Assessment: no apparent nausea or vomiting Anesthetic complications: no   No complications documented.   Last Vitals:  Vitals:   08/16/20 0724 08/16/20 0859  BP: (!) 144/85 (!) 148/82  Pulse: 63 70  Resp: 14 10  Temp: 36.8 C 36.8 C  SpO2: 100% 100%    Last Pain:  Vitals:   08/16/20 0859  TempSrc: Oral  PainSc: 0-No pain                 Orlie Dakin

## 2020-08-16 NOTE — Interval H&P Note (Signed)
History and Physical Interval Note:  08/16/2020 7:45 AM  Lawn  has presented today for surgery, with the diagnosis of anemia, gerd, dysphagia.  The various methods of treatment have been discussed with the patient and family. After consideration of risks, benefits and other options for treatment, the patient has consented to  Procedure(s) with comments: ESOPHAGOGASTRODUODENOSCOPY (EGD) WITH PROPOFOL (N/A) - 8:30am COLONOSCOPY WITH PROPOFOL (N/A) as a surgical intervention.  The patient's history has been reviewed, patient examined, no change in status, stable for surgery.  I have reviewed the patient's chart and labs.  Questions were answered to the patient's satisfaction.     Eloise Harman

## 2020-08-17 LAB — SURGICAL PATHOLOGY

## 2020-08-19 ENCOUNTER — Encounter (INDEPENDENT_AMBULATORY_CARE_PROVIDER_SITE_OTHER): Payer: Self-pay | Admitting: Internal Medicine

## 2020-08-19 ENCOUNTER — Other Ambulatory Visit (INDEPENDENT_AMBULATORY_CARE_PROVIDER_SITE_OTHER): Payer: Self-pay | Admitting: Internal Medicine

## 2020-08-19 ENCOUNTER — Other Ambulatory Visit: Payer: Self-pay

## 2020-08-19 ENCOUNTER — Ambulatory Visit (INDEPENDENT_AMBULATORY_CARE_PROVIDER_SITE_OTHER): Payer: BC Managed Care – PPO | Admitting: Internal Medicine

## 2020-08-19 VITALS — BP 128/78 | HR 80 | Temp 97.7°F | Ht 72.0 in | Wt 299.0 lb

## 2020-08-19 DIAGNOSIS — D649 Anemia, unspecified: Secondary | ICD-10-CM | POA: Diagnosis not present

## 2020-08-19 DIAGNOSIS — R5383 Other fatigue: Secondary | ICD-10-CM

## 2020-08-19 DIAGNOSIS — E669 Obesity, unspecified: Secondary | ICD-10-CM

## 2020-08-19 DIAGNOSIS — R6889 Other general symptoms and signs: Secondary | ICD-10-CM

## 2020-08-19 DIAGNOSIS — R5381 Other malaise: Secondary | ICD-10-CM

## 2020-08-19 DIAGNOSIS — E119 Type 2 diabetes mellitus without complications: Secondary | ICD-10-CM

## 2020-08-19 MED ORDER — RYBELSUS 3 MG PO TABS: 3.0000 mg | ORAL_TABLET | Freq: Every day | ORAL | 3 refills | Status: DC

## 2020-08-19 NOTE — Progress Notes (Signed)
Metrics: Intervention Frequency ACO  Documented Smoking Status Yearly  Screened one or more times in 24 months  Cessation Counseling or  Active cessation medication Past 24 months  Past 24 months   Guideline developer: UpToDate (See UpToDate for funding source) Date Released: 2014       Wellness Office Visit  Subjective:  Patient ID: Marie Spears, female    DOB: 1975-12-06  Age: 45 y.o. MRN: [id number]  CC: This lady comes in for follow-up of morbid obesity, diabetes, anemia, cold intolerance. HPI  She did have colonoscopy and EGD by gastroenterology regarding her iron deficiency and no obvious cause has been found but she did have gastritis and esophagitis which is possible that she bled from these areas. She has tolerated the higher dose of desiccated NP thyroid 60 mg daily. As far as her diabetes is concerned she is try to control this with diet alone. Unfortunately as far as diet is concerned, she has not been consistent with intermittent fasting and a plant-based diet that we discussed previously. Past Medical History:  Diagnosis Date  . Asthma   . Bronchitis   . Diabetes mellitus without complication ([place])   . DVT (deep venous thrombosis) ([place])   . Obesity    Past Surgical History:  Procedure Laterality Date  . BARIATRIC SURGERY  06/23/2019   [facility] 347lbs prior to surgery.  Marland Kitchen HERNIA REPAIR       Family History  Problem Relation Age of Onset  . Heart disease Mother   . Diabetes Mother   . Hypertension Father   . Diabetes Sister   . 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   Social History   Tobacco Use  . Smoking status: Never Smoker  . Smokeless tobacco: Never Used  Substance Use Topics  . Alcohol use: No    Current Meds  Medication Sig  . albuterol (VENTOLIN HFA) 108 (90 Base) MCG/ACT inhaler Inhale 2  puffs into the lungs every 4 (four) hours as needed for wheezing or shortness of breath.  . Cholecalciferol (VITAMIN D-3) 125 MCG (5000 UT) TABS Take 2 tablets by mouth daily.  . Multiple Vitamins-Minerals (BARIATRIC MULTIVITAMINS/IRON PO) Take 45 mg by mouth daily.  . NP THYROID 60 MG tablet Take 1 tablet (60 mg total) by mouth daily before breakfast.  . omeprazole (PRILOSEC) 20 MG capsule Take 1 capsule (20 mg total) by mouth 2 (two) times daily before a meal. Take 30 min before breakfast and 30 min before dinner  . Semaglutide (RYBELSUS) 3 MG TABS Take 3 mg by mouth daily.     [place] Office Visit from 08/19/2020 in [facility]  PHQ-9 Total Score 0      Objective:   Today's Vitals: BP 128/78   Pulse 80   Temp 97.7 F (36.5 C) (Temporal)   Ht 6' (1.829 m)   Wt 299 lb (135.6 kg)   LMP 07/21/2020   SpO2 99%   BMI 40.55 kg/m  Vitals with BMI 08/19/2020 08/16/2020 08/16/2020  Height 6\' 0"  - 6\' 0"   Weight 299 lbs - (No Data)  BMI 78.29 - -  Systolic 562 130 865  Diastolic 78 82 85  Pulse 80 70 63     Physical Exam   Unfortunately, she has gained weight since the last time I saw her.  Blood pressure is acceptable.  Assessment   1. Cold intolerance   2. Diabetes mellitus without complication (HCC)   3. Obesity (BMI 30-39.9)   4. Anemia, unspecified type       Tests ordered Orders Placed This Encounter  Procedures  . CBC  . T3, free  . TSH  . COMPLETE METABOLIC PANEL WITH GFR  . Hemoglobin A1c     Plan: 1. She will continue with the NP thyroid 60 mg daily and we will check blood work today to see if we need to adjust further. 2. In terms of her diabetes, I think we can try Rybelsus 3 mg daily to see if this will help her. 3. We also discussed nutrition again and the importance of trying to stick to intermittent fasting and a plant-based diet, maintaining hydration. 4. Follow-up in 1 month for close monitoring.   Meds ordered this  encounter  Medications  . Semaglutide (RYBELSUS) 3 MG TABS    Sig: Take 3 mg by mouth daily.    Dispense:  30 tablet    Refill:  3    Nimish Luther Parody, MD

## 2020-08-20 LAB — COMPLETE METABOLIC PANEL WITH GFR
AG Ratio: 1.4 (calc) (ref 1.0–2.5)
ALT: 33 U/L — ABNORMAL HIGH (ref 6–29)
AST: 26 U/L (ref 10–35)
Albumin: 4.1 g/dL (ref 3.6–5.1)
Alkaline phosphatase (APISO): 66 U/L (ref 31–125)
BUN: 18 mg/dL (ref 7–25)
CO2: 30 mmol/L (ref 20–32)
Calcium: 9.4 mg/dL (ref 8.6–10.2)
Chloride: 106 mmol/L (ref 98–110)
Creat: 0.9 mg/dL (ref 0.50–1.10)
GFR, Est African American: 88 mL/min/{1.73_m2} (ref >=60)
GFR, Est Non African American: 76 mL/min/{1.73_m2} (ref >=60)
Globulin: 2.9 g/dL (calc) (ref 1.9–3.7)
Glucose, Bld: 94 mg/dL (ref 65–139)
Potassium: 3.9 mmol/L (ref 3.5–5.3)
Sodium: 141 mmol/L (ref 135–146)
Total Bilirubin: 0.6 mg/dL (ref 0.2–1.2)
Total Protein: 7 g/dL (ref 6.1–8.1)

## 2020-08-20 LAB — TSH: TSH: 0.52 mIU/L

## 2020-08-20 LAB — CBC
HCT: 38.6 % (ref 35.0–45.0)
Hemoglobin: 12.7 g/dL (ref 11.7–15.5)
MCH: 27.5 pg (ref 27.0–33.0)
MCHC: 32.9 g/dL (ref 32.0–36.0)
MCV: 83.5 fL (ref 80.0–100.0)
MPV: 11.8 fL (ref 7.5–12.5)
Platelets: 241 10*3/uL (ref 140–400)
RBC: 4.62 10*6/uL (ref 3.80–5.10)
RDW: 14.4 % (ref 11.0–15.0)
WBC: 3.7 10*3/uL — ABNORMAL LOW (ref 3.8–10.8)

## 2020-08-20 LAB — HEMOGLOBIN A1C
Hgb A1c MFr Bld: 6.2 % of total Hgb — ABNORMAL HIGH (ref <5.7)
Mean Plasma Glucose: 131 mg/dL
eAG (mmol/L): 7.3 mmol/L

## 2020-08-20 LAB — T3, FREE: T3, Free: 4.3 pg/mL — ABNORMAL HIGH (ref 2.3–4.2)

## 2020-09-30 ENCOUNTER — Ambulatory Visit (INDEPENDENT_AMBULATORY_CARE_PROVIDER_SITE_OTHER): Payer: BC Managed Care – PPO | Admitting: Internal Medicine

## 2020-10-14 ENCOUNTER — Other Ambulatory Visit: Payer: Self-pay

## 2020-10-14 ENCOUNTER — Ambulatory Visit
Admission: RE | Admit: 2020-10-14 | Discharge: 2020-10-14 | Disposition: A | Discharge status: Home / Self Care | Payer: BC Managed Care – PPO | Source: Ambulatory Visit | Attending: Emergency Medicine | Admitting: Emergency Medicine

## 2020-10-14 VITALS — BP 118/83 | HR 77 | Temp 98.0°F | Resp 18

## 2020-10-14 DIAGNOSIS — L02214 Cutaneous abscess of groin: Secondary | ICD-10-CM

## 2020-10-14 MED ORDER — DOXYCYCLINE HYCLATE 100 MG PO CAPS: 100.0000 mg | ORAL_CAPSULE | Freq: Two times a day (BID) | ORAL | 0 refills | Status: AC

## 2020-10-14 NOTE — ED Triage Notes (Signed)
Pt noticed bump to front left side groin area a week ago. States has since erupted and has area covered with gauze.   States blood sugars may have been running high.

## 2020-10-14 NOTE — ED Provider Notes (Signed)
[facility]    CSN: [id number] Arrival date & time: 10/14/20  1004      History   Chief Complaint Chief Complaint  Patient presents with  . Abscess    HPI Marie Spears is a 45 y.o. female history of asthma, DM type II, presenting today for evaluation of an abscess.  Reports abscess developing to pubic area/groin approximately 1 week ago.  Reports began draining today with pustular material.  Denies current fevers, but does report possible fever a few days ago.  Denies dizziness or lightheadedness.  HPI  Past Medical History:  Diagnosis Date  . Asthma   . Bronchitis   . Diabetes mellitus without complication ([place])   . DVT (deep venous thrombosis) ([place])   . Obesity     Patient Active Problem List   Diagnosis Date Noted  . Acid reflux 07/21/2020  . Dysphagia 07/21/2020  . Normocytic anemia 07/21/2020  . Asthma 08/03/2018  . Status post right cataract extraction 11/29/2017  . Age-related nuclear cataract of left eye 10/24/2017  . Anterior subcapsular cataract of right eye 10/24/2017  . Posterior subcapsular age-related cataract of both eyes 10/24/2017  . Diabetes mellitus ([place]) 07/21/2016  . Hyperlipidemia 07/21/2016  . Hidradenitis suppurativa 07/20/2016  . Diabetes mellitus type 2 in obese ([place]) 03/11/2015  . Morbid obesity ([place]) 03/11/2015    Past Surgical History:  Procedure Laterality Date  . BARIATRIC SURGERY  06/23/2019   [facility] 347lbs prior to surgery.  Marland Kitchen BIOPSY  08/16/2020   Procedure: BIOPSY;  Surgeon: Eloise Harman, DO;  [facility];  Service: Endoscopy;;  . COLONOSCOPY WITH PROPOFOL N/A 08/16/2020   Procedure: COLONOSCOPY WITH PROPOFOL;  Surgeon: Eloise Harman, DO;  [facility];  Service: Endoscopy;  Laterality: N/A;  . ESOPHAGOGASTRODUODENOSCOPY (EGD) WITH PROPOFOL N/A 08/16/2020   Procedure: ESOPHAGOGASTRODUODENOSCOPY (EGD) WITH PROPOFOL;  Surgeon: Eloise Harman, DO;  [facility];   Service: Endoscopy;  Laterality: N/A;  8:30am  . HERNIA REPAIR    . POLYPECTOMY  08/16/2020   Procedure: POLYPECTOMY;  Surgeon: Eloise Harman, DO;  [facility];  Service: Endoscopy;;    OB History   No obstetric history on file.      Home Medications    Prior to Admission medications   Medication Sig Start Date End Date Taking? Authorizing Provider  albuterol (VENTOLIN HFA) 108 (90 Base) MCG/ACT inhaler Inhale 2 puffs into the lungs every 4 (four) hours as needed for wheezing or shortness of breath. 06/15/20  Yes Gosrani, Nimish C, MD  Cholecalciferol (VITAMIN D-3) 125 MCG (5000 UT) TABS Take 2 tablets by mouth daily.   Yes [provider]  doxycycline (VIBRAMYCIN) 100 MG capsule Take 1 capsule (100 mg total) by mouth 2 (two) times daily for 10 days. 10/14/20 10/24/20 Yes Krystine Pabst C, PA-C  Multiple Vitamins-Minerals (BARIATRIC MULTIVITAMINS/IRON PO) Take 45 mg by mouth daily. 07/15/19  Yes [provider]  NP THYROID 60 MG tablet Take 1 tablet (60 mg total) by mouth daily before breakfast. 07/29/20  Yes Gosrani, Nimish C, MD  omeprazole (PRILOSEC) 20 MG capsule Take 1 capsule (20 mg total) by mouth 2 (two) times daily before a meal. Take 30 min before breakfast and 30 min before dinner 08/16/20 02/12/21 Yes Carver, Charles K, DO  Semaglutide (RYBELSUS) 3 MG TABS Take 3 mg by mouth daily. 08/19/20  Yes Doree Albee, MD    Family History Family History  Problem Relation Age  of Onset  . Heart disease Mother   . Diabetes Mother   . Hypertension Father   . Diabetes Sister   . Diabetes Brother     Social History Social History   Tobacco Use  . Smoking status: Never Smoker  . Smokeless tobacco: Never Used  Vaping Use  . Vaping Use: Never used  Substance Use Topics  . Alcohol use: Yes    Comment: occasionally  . Drug use: No     Allergies   Penicillins, Other, and Lisinopril   Review of Systems Review of Systems  Constitutional:  Negative for fatigue and fever.  HENT: Negative for mouth sores.   Eyes: Negative for visual disturbance.  Respiratory: Negative for shortness of breath.   Cardiovascular: Negative for chest pain.  Gastrointestinal: Negative for abdominal pain, nausea and vomiting.  Genitourinary: Negative for genital sores.  Musculoskeletal: Negative for arthralgias and joint swelling.  Skin: Positive for color change and wound. Negative for rash.  Neurological: Negative for dizziness, weakness, light-headedness and headaches.     Physical Exam Triage Vital Signs ED Triage Vitals  Enc Vitals Group     BP 10/14/20 1025 118/83     Pulse Rate 10/14/20 1025 77     Resp 10/14/20 1025 18     Temp 10/14/20 1025 98 F (36.7 C)     Temp src --      SpO2 10/14/20 1025 96 %     Weight --      Height --      Head Circumference --      Peak Flow --      Pain Score 10/14/20 1023 6     Pain Loc --      Pain Edu? --      Excl. in [place]? --    No data found.  Updated Vital Signs BP 118/83   Pulse 77   Temp 98 F (36.7 C)   Resp 18   LMP 10/07/2020   SpO2 96%   Visual Acuity Right Eye Distance:   Left Eye Distance:   Bilateral Distance:    Right Eye Near:   Left Eye Near:    Bilateral Near:     Physical Exam Vitals and nursing note reviewed.  Constitutional:      Appearance: She is well-developed.     Comments: No acute distress  HENT:     Head: Normocephalic and atraumatic.     Nose: Nose normal.  Eyes:     Conjunctiva/sclera: Conjunctivae normal.  Cardiovascular:     Rate and Rhythm: Normal rate.  Pulmonary:     Effort: Pulmonary effort is normal. No respiratory distress.  Abdominal:     General: There is no distension.  Genitourinary:    Comments: Left area of mons pubis with large area of erythema and induration with central fluctuance and central area actively draining bloody serous fluid, no extension onto the labia majora Musculoskeletal:        General: Normal range of  motion.     Cervical back: Neck supple.  Skin:    General: Skin is warm and dry.  Neurological:     Mental Status: She is alert and oriented to person, place, and time.      UC Treatments / Results  Labs (all labs ordered are listed, but only abnormal results are displayed) Labs Reviewed - No data to display  EKG   Radiology No results found.  Procedures Procedures (including critical care time)  Medications Ordered  in UC Medications - No data to display  Initial Impression / Assessment and Plan / UC Course  I have reviewed the triage vital signs and the nursing notes.  Pertinent labs & imaging results that were available during my care of the patient were reviewed by me and considered in my medical decision making (see chart for details).     Abscess to pubic area, currently actively draining, will defer further I&D at this time and will initiate on antibiotics as well as encourage continued warm compresses with gentle massage to express further drainage.  Vital signs stable today without any fever or tachycardia.  Advised patient to monitor for improvement of symptoms, patient to return in 3 days if not seeing any improvement with antibiotics/further drainage.  Discussed strict return precautions. Patient verbalized understanding and is agreeable with plan.  Final Clinical Impressions(s) / UC Diagnoses   Final diagnoses:  Abscess of groin, left     Discharge Instructions     Please begin doxycycline for 10 days  Apply warm compresses/hot rags to area with massage to express further drainage especially the first 24-48 hours  Return if symptoms returning or not improving in approximately 3 days    ED Prescriptions    Medication Sig Dispense Auth. Provider   doxycycline (VIBRAMYCIN) 100 MG capsule Take 1 capsule (100 mg total) by mouth 2 (two) times daily for 10 days. 20 capsule Emilyanne Mcgough, [place] C, PA-C     PDMP not reviewed this encounter.   Janith Lima, PA-C 10/14/20 1053

## 2020-10-14 NOTE — Discharge Instructions (Signed)
Please begin doxycycline for 10 days  Apply warm compresses/hot rags to area with massage to express further drainage especially the first 24-48 hours  Return if symptoms returning or not improving in approximately 3 days

## 2020-10-15 DIAGNOSIS — K635 Polyp of colon: Secondary | ICD-10-CM | POA: Insufficient documentation

## 2020-10-20 IMAGING — CT CT ANGIO CHEST
2 of 10 series · 18 of 36 positions shown · IV contrast (omnipaque)
Comparison: 12/05/2006

CLINICAL DATA: Shortness of breath, recent surgery, leg pain,
previous gastric sleeve

EXAM:
CT ANGIOGRAPHY CHEST WITH CONTRAST
TECHNIQUE: Multidetector CT imaging of the chest was performed using the
standard protocol during bolus administration of intravenous
contrast. Multiplanar CT image reconstructions and MIPs were
obtained to evaluate the vascular anatomy.
CONTRAST:  90mL OMNIPAQUE IOHEXOL 350 MG/ML SOLN

[Series 8: pe thins · axial · 0.86mm/px · z∈[-280,-25]mm · 17 of 287 slices shown]
[im 16/287  lung]
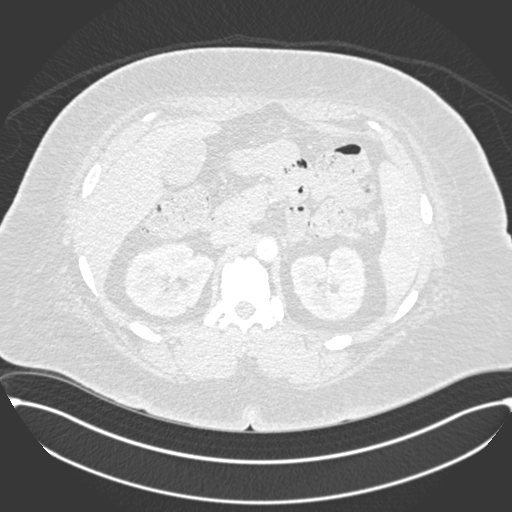
[im 32/287  mediastinal]
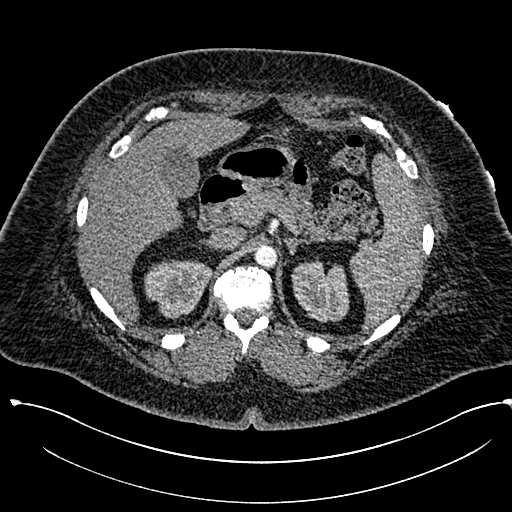
[im 48/287  lung]
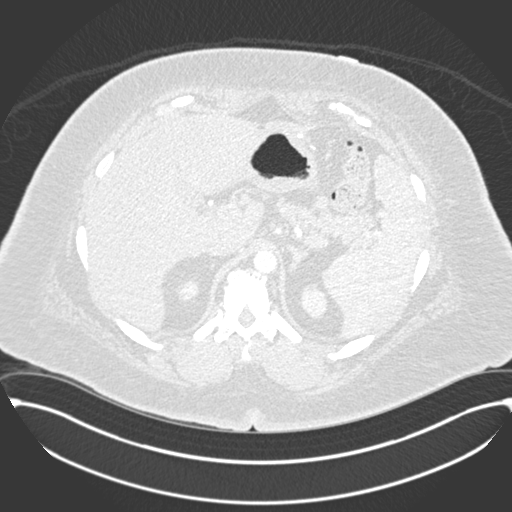
[im 64/287  mediastinal]
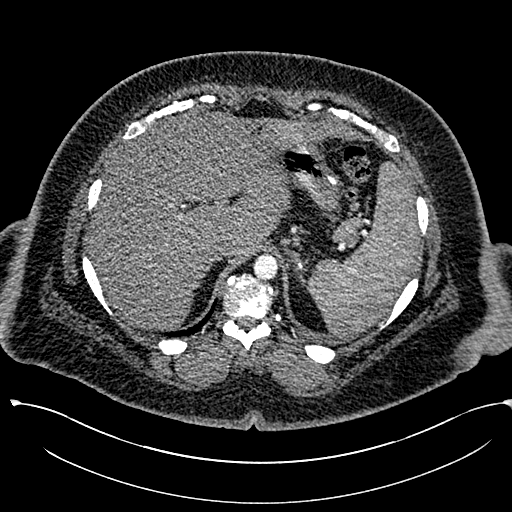
[im 80/287  lung]
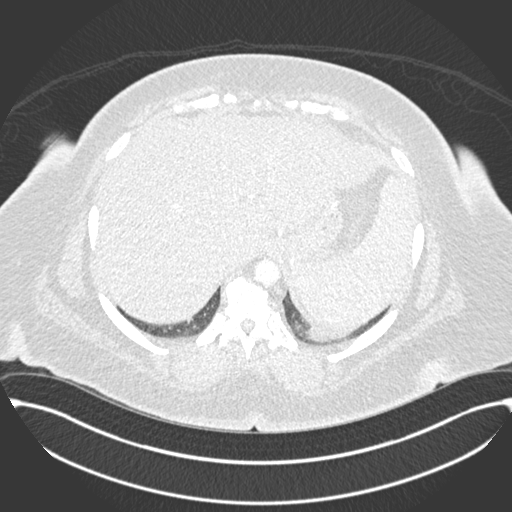
[im 96/287  mediastinal]
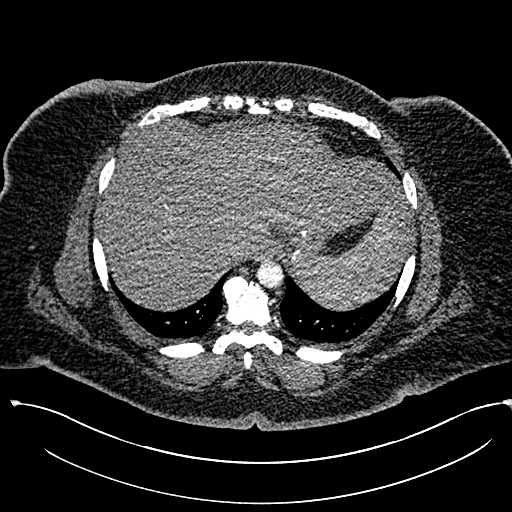
[im 112/287  lung]
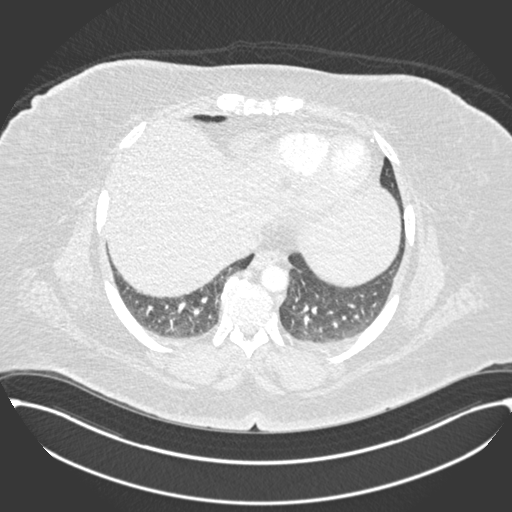
[im 128/287  mediastinal]
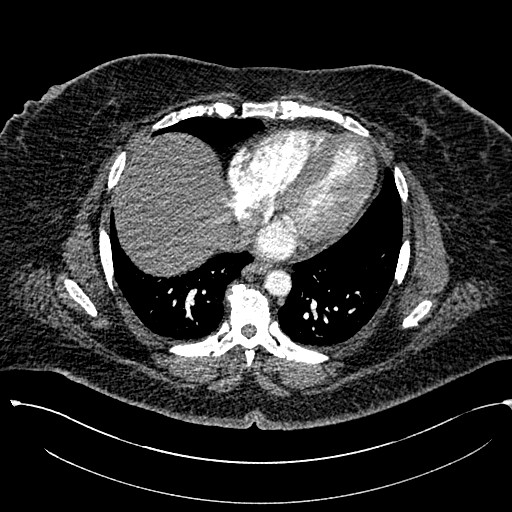
[im 144/287  lung]
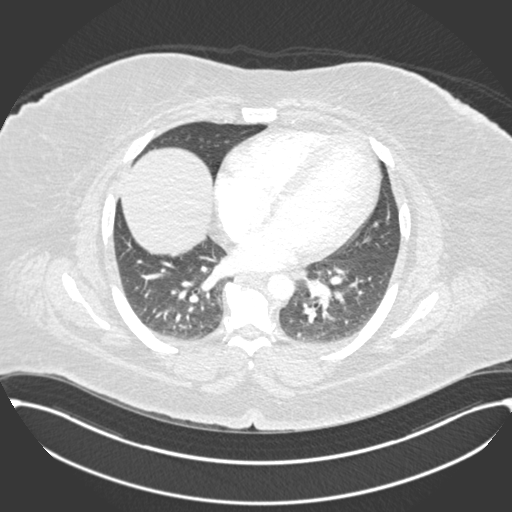
[im 159/287  mediastinal]
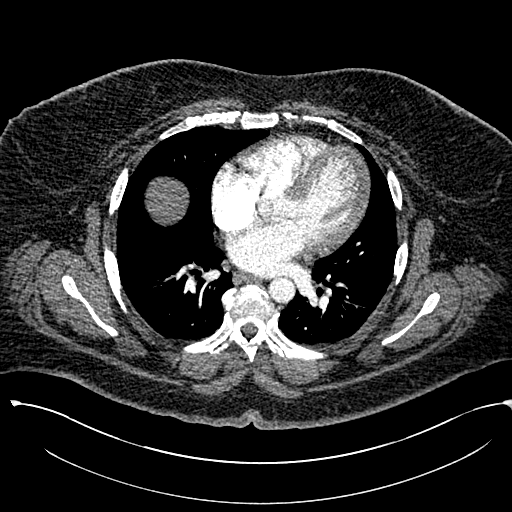
[im 175/287  lung]
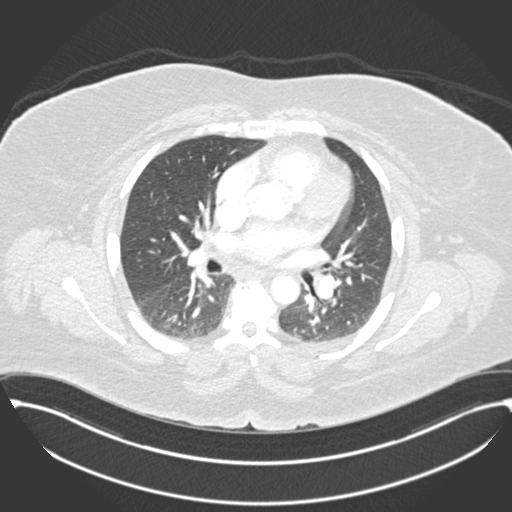
[im 191/287  mediastinal]
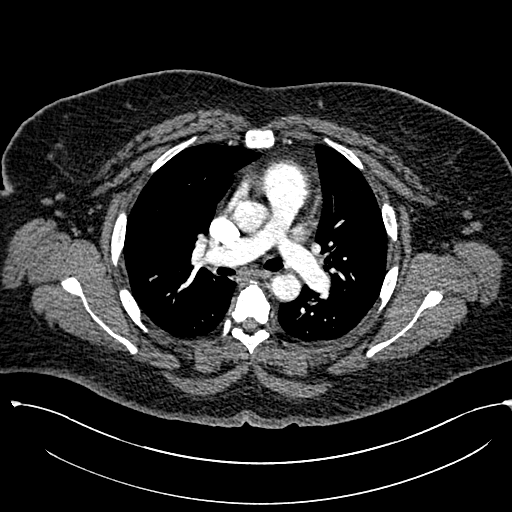
[im 207/287  lung]
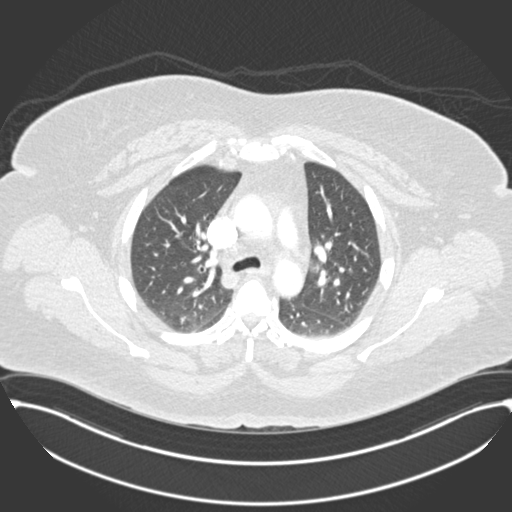
[im 223/287  mediastinal]
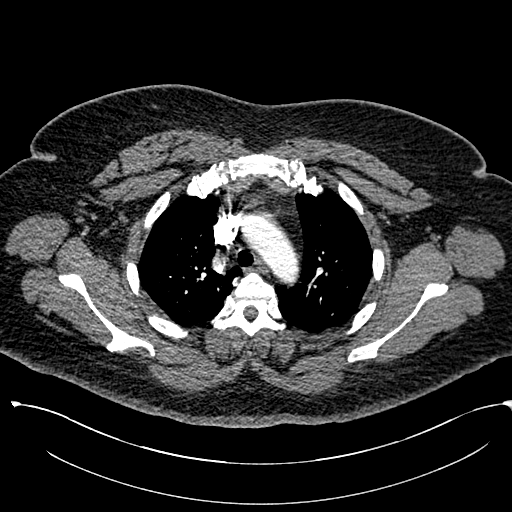
[im 239/287  lung]
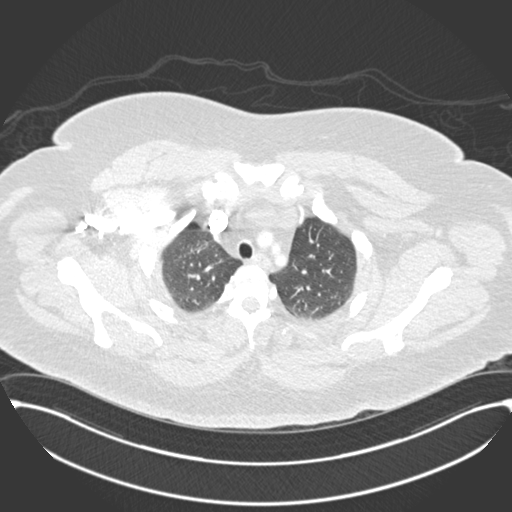
[im 255/287  mediastinal]
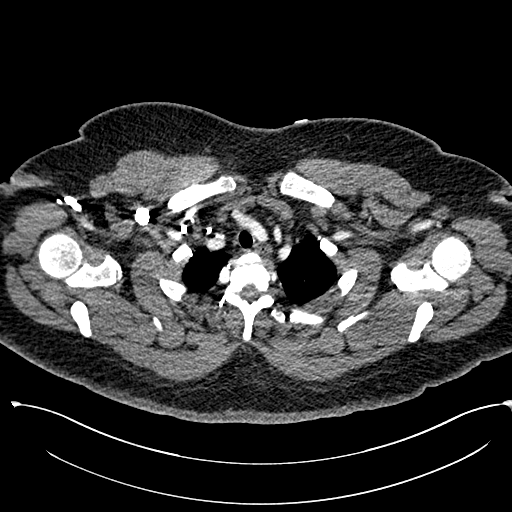
[im 271/287  lung]
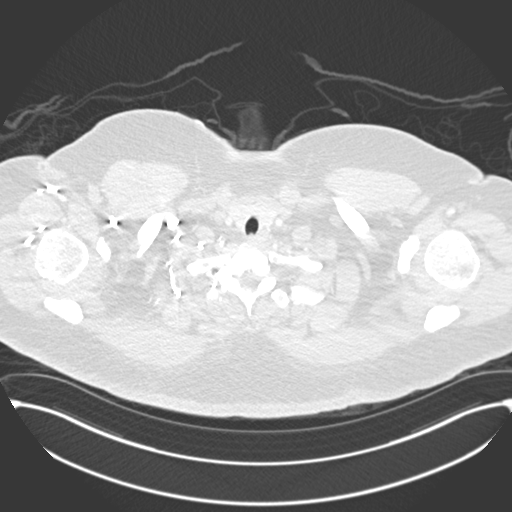

[Series 9: pe coronal mpr · coronal · 0.59mm/px · 1 of 151 slices shown]
[im 76/151  mediastinal]
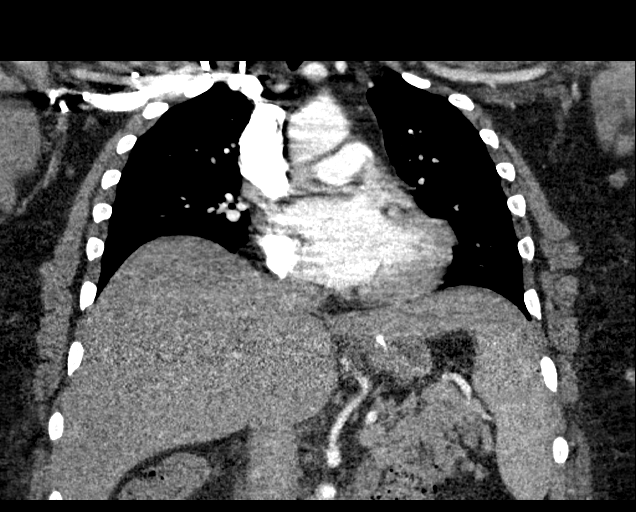

[18 of 36 positions shown; findings below may reference images not displayed]

FINDINGS: Cardiovascular: No significant filling defect or pulmonary embolus
demonstrated by CTA. Pulmonary arteries appear patent. No
pericardial effusion. Normal heart size. Intact thoracic aorta.

Mediastinum/Nodes: No enlarged mediastinal, hilar, or axillary lymph
nodes. Thyroid gland, trachea, and esophagus demonstrate no
significant findings.

Lungs/Pleura: Minor dependent lower lobe hypoventilatory changes. No
focal acute airspace process, collapse or consolidation. No
interstitial process or edema. No pleural abnormality, effusion or
pneumothorax. Trachea and central airways are patent.

Upper Abdomen: Gastric sleeve surgery changes noted. No acute
finding in the upper abdomen.

Musculoskeletal: Degenerative changes noted of the spine. No acute
osseous finding.

Review of the MIP images confirms the above findings.
IMPRESSION: Negative for significant acute pulmonary embolus by CTA.

No other acute intrathoracic finding by CT.

## 2020-10-20 IMAGING — US US EXTREM LOW VENOUS*R*
1 series · 13 of 24 positions shown · non-contrast
Comparison: Right lower extremity venous Doppler ultrasound
04/06/2007

CLINICAL DATA: C/o bilat foot pain radiating up both legs (R>L) x 2
days. S/p gastric sleeve surgery on [DATE]. Obesity limits scan.
in Lt leg in 2772.



[Series 1: us extrem low venous*right* · 13 of 33 slices shown]
[im 1/33]
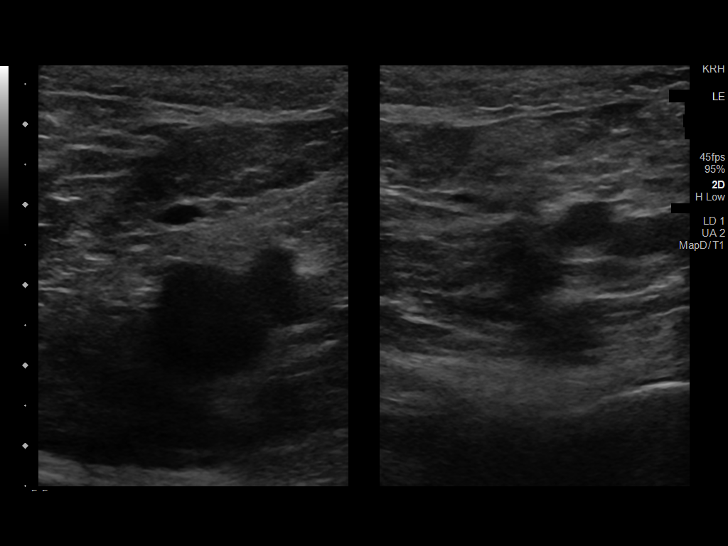
[im 3/33]
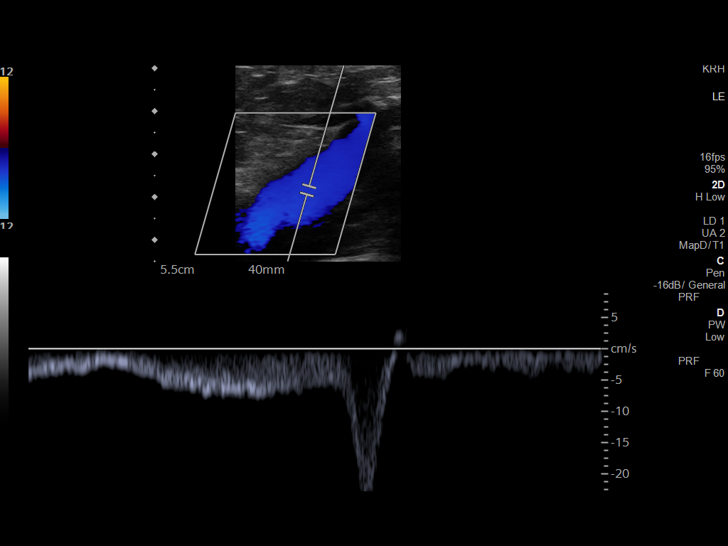
[im 6/33]
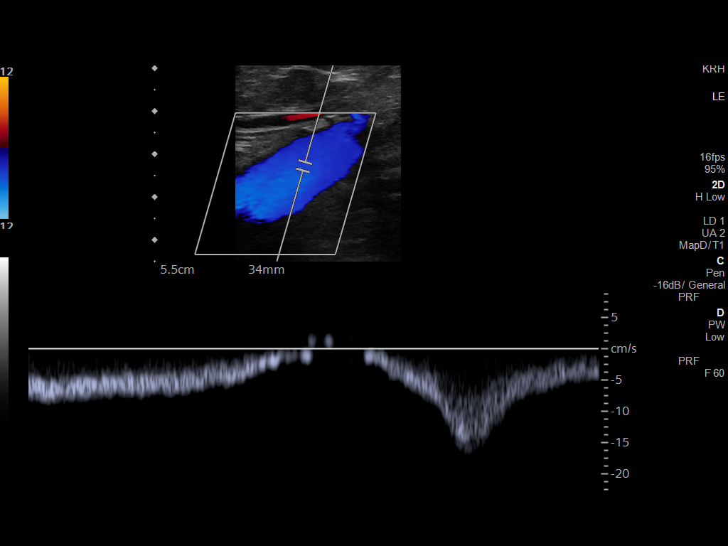
[im 9/33]
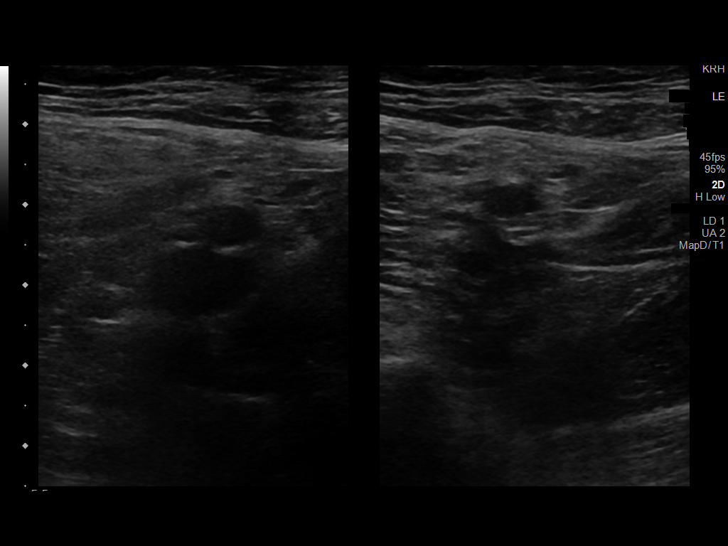
[im 12/33]
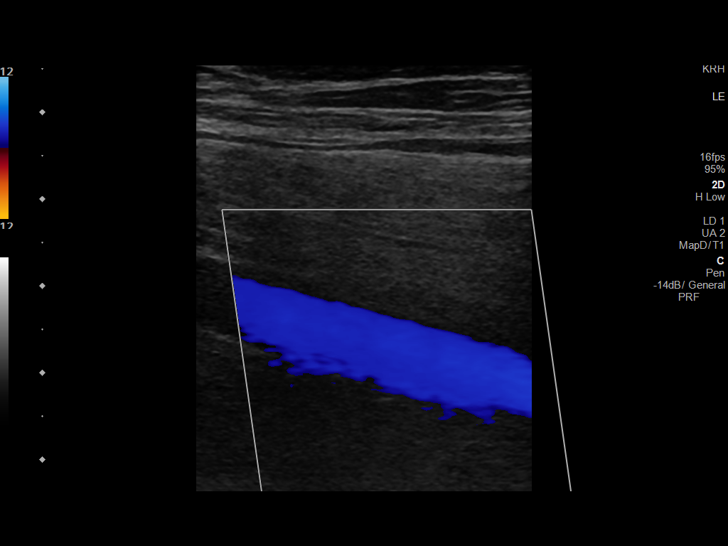
[im 14/33]
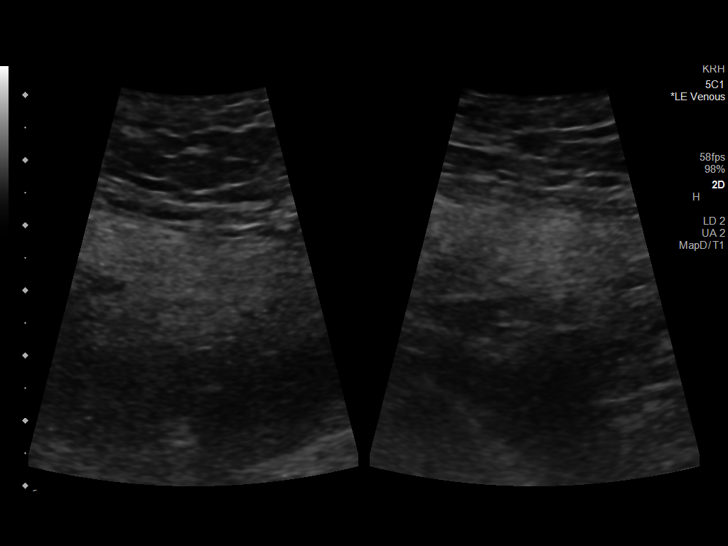
[im 17/33]
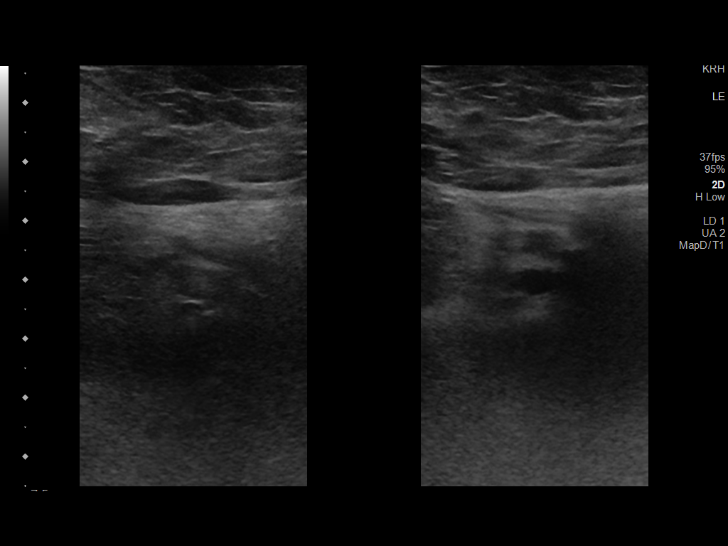
[im 19/33]
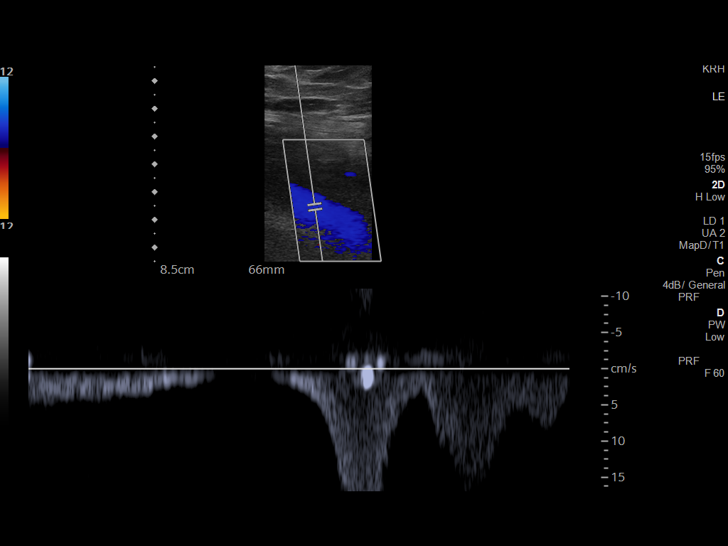
[im 21/33]
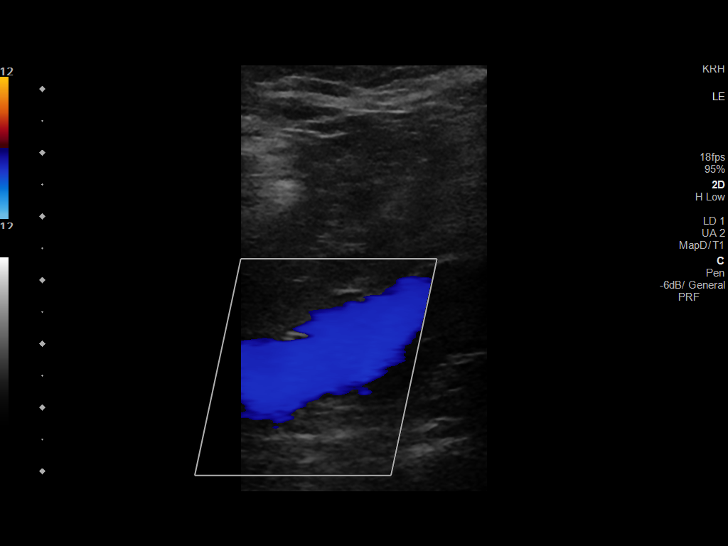
[im 24/33]
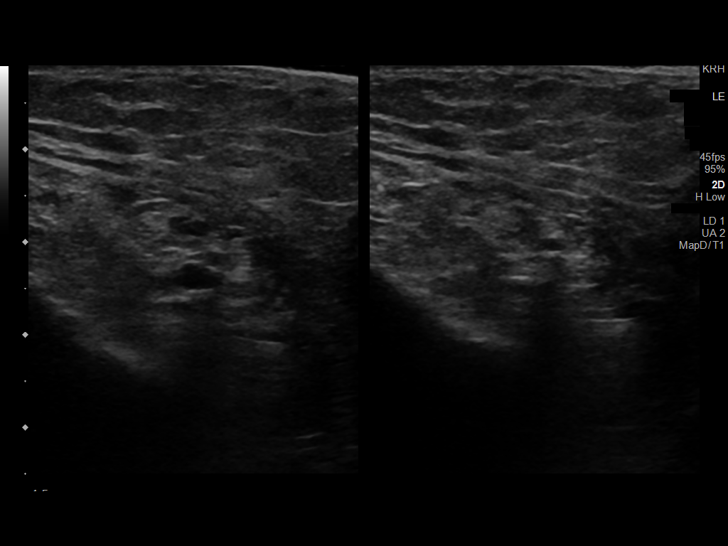
[im 27/33]
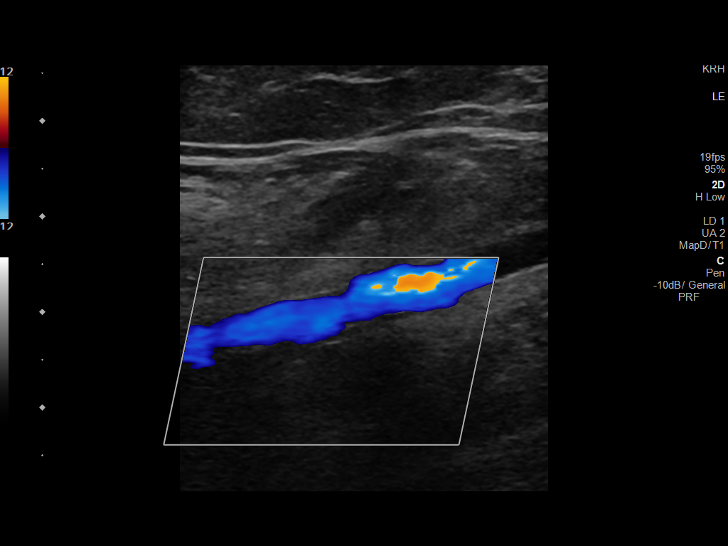
[im 30/33]
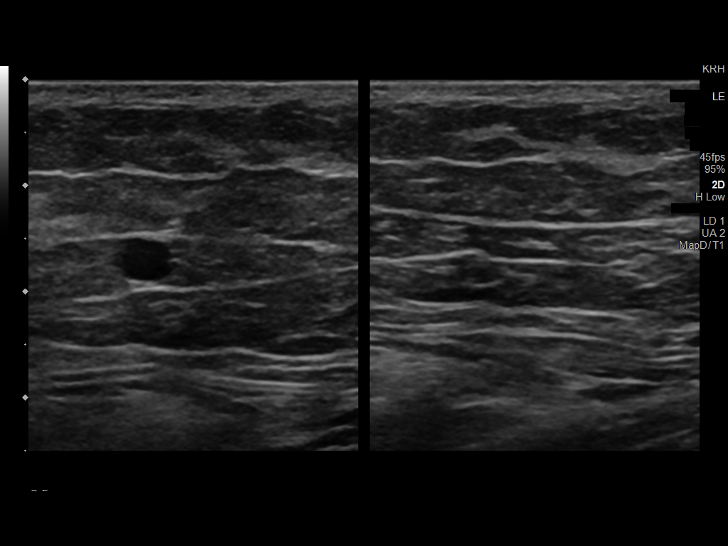
[im 33/33]
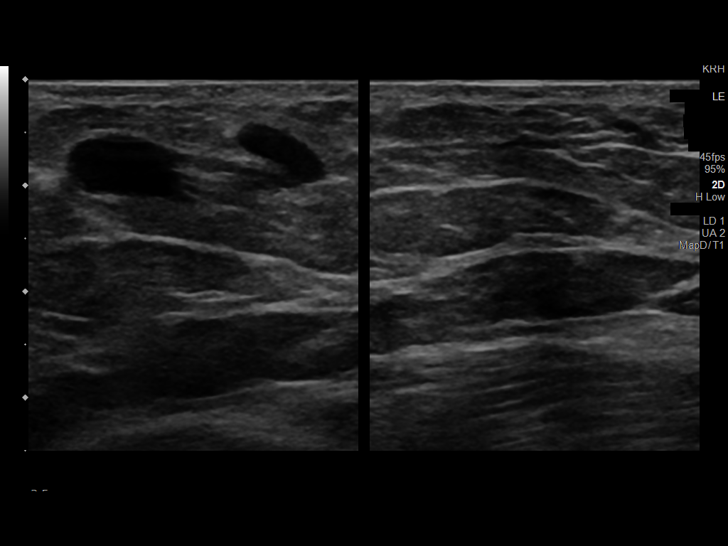

[13 of 24 positions shown; findings below may reference images not displayed]

FINDINGS: Contralateral Common Femoral Vein: Respiratory phasicity is normal
and symmetric with the symptomatic side. No evidence of thrombus.
Normal compressibility.

Common Femoral Vein: No evidence of thrombus. Normal
compressibility, respiratory phasicity and response to augmentation.

Saphenofemoral Junction: No evidence of thrombus. Normal
compressibility and flow on color Doppler imaging.

Profunda Femoral Vein: No evidence of thrombus. Normal
compressibility and flow on color Doppler imaging.

Femoral Vein: No evidence of thrombus. Normal compressibility,
respiratory phasicity and response to augmentation.

Popliteal Vein: No evidence of thrombus. Normal compressibility,
respiratory phasicity and response to augmentation.

Calf Veins: No evidence of thrombus in visualized portions. The
peroneal vein was not seen. Normal compressibility and flow on color
Doppler imaging.

Superficial Great Saphenous Vein: No evidence of thrombus. Normal
compressibility.

Other Findings: Several superficial varicose veins noted in the
right calf and thigh.
IMPRESSION: No evidence of deep venous thrombosis in the right lower extremity.

## 2020-11-16 ENCOUNTER — Ambulatory Visit: Payer: BC Managed Care – PPO | Admitting: Gastroenterology

## 2020-11-22 ENCOUNTER — Encounter (INDEPENDENT_AMBULATORY_CARE_PROVIDER_SITE_OTHER): Payer: Self-pay | Admitting: Internal Medicine

## 2020-11-23 ENCOUNTER — Other Ambulatory Visit (INDEPENDENT_AMBULATORY_CARE_PROVIDER_SITE_OTHER): Payer: Self-pay | Admitting: Internal Medicine

## 2020-11-23 MED ORDER — NP THYROID 60 MG PO TABS: 60.0000 mg | ORAL_TABLET | Freq: Every day | ORAL | 3 refills | Status: DC

## 2020-12-02 ENCOUNTER — Ambulatory Visit (INDEPENDENT_AMBULATORY_CARE_PROVIDER_SITE_OTHER): Payer: BC Managed Care – PPO | Admitting: Internal Medicine

## 2020-12-23 ENCOUNTER — Other Ambulatory Visit: Payer: Self-pay

## 2020-12-23 ENCOUNTER — Ambulatory Visit: Payer: BC Managed Care – PPO

## 2020-12-23 ENCOUNTER — Ambulatory Visit
Admission: RE | Admit: 2020-12-23 | Discharge: 2020-12-23 | Disposition: A | Discharge status: Home / Self Care | Payer: BC Managed Care – PPO | Source: Ambulatory Visit | Attending: Emergency Medicine | Admitting: Emergency Medicine

## 2020-12-23 VITALS — BP 153/87 | HR 78 | Temp 98.9°F | Resp 18

## 2020-12-23 DIAGNOSIS — R030 Elevated blood-pressure reading, without diagnosis of hypertension: Secondary | ICD-10-CM

## 2020-12-23 DIAGNOSIS — R350 Frequency of micturition: Secondary | ICD-10-CM

## 2020-12-23 DIAGNOSIS — M545 Low back pain, unspecified: Secondary | ICD-10-CM

## 2020-12-23 LAB — POCT URINALYSIS DIP (MANUAL ENTRY)
Bilirubin, UA: NEGATIVE
Blood, UA: NEGATIVE
Glucose, UA: NEGATIVE mg/dL
Ketones, POC UA: NEGATIVE mg/dL
Leukocytes, UA: NEGATIVE
Nitrite, UA: NEGATIVE
Protein Ur, POC: NEGATIVE mg/dL
Spec Grav, UA: 1.025 (ref 1.010–1.025)
Urobilinogen, UA: 0.2 E.U./dL
pH, UA: 5.5 (ref 5.0–8.0)

## 2020-12-23 LAB — POCT FASTING CBG KUC MANUAL ENTRY: POCT Glucose (KUC): 129 mg/dL — AB (ref 70–99)

## 2020-12-23 MED ORDER — IBUPROFEN 800 MG PO TABS: 800.0000 mg | ORAL_TABLET | Freq: Three times a day (TID) | ORAL | 0 refills | Status: DC | PRN

## 2020-12-23 MED ORDER — METHOCARBAMOL 500 MG PO TABS: 500.0000 mg | ORAL_TABLET | Freq: Two times a day (BID) | ORAL | 0 refills | Status: DC | PRN

## 2020-12-23 NOTE — ED Provider Notes (Signed)
Roderic Palau    CSN: [id number] Arrival date & time: 12/23/20  1107      History   Chief Complaint Chief Complaint  Patient presents with   Dysuria   Back Pain    HPI Marie Spears is a 45 y.o. female.  Patient presents with urinary frequency and bilateral low back pain x1 week.  She denies dysuria, hematuria, abdominal pain, vaginal discharge, pelvic pain, or other symptoms.  The back pain is nonradiating, aching, 7/10, worse with movement, improves with rest.  No saddle anesthesia, loss of bowel/bladder control, numbness, weakness, paresthesias.  No falls or injury.  No treatments attempted at home.  Her medical history includes diabetes, asthma, obesity, DVT.  The history is provided by the patient and medical records.   Past Medical History:  Diagnosis Date   Asthma    Bronchitis    Diabetes mellitus without complication ([place])    DVT (deep venous thrombosis) ([place])    Obesity     Patient Active Problem List   Diagnosis Date Noted   Acid reflux 07/21/2020   Dysphagia 07/21/2020   Normocytic anemia 07/21/2020   Asthma 08/03/2018   Status post right cataract extraction 11/29/2017   Age-related nuclear cataract of left eye 10/24/2017   Anterior subcapsular cataract of right eye 10/24/2017   Posterior subcapsular age-related cataract of both eyes 10/24/2017   Diabetes mellitus ([place]) 07/21/2016   Hyperlipidemia 07/21/2016   Hidradenitis suppurativa 07/20/2016   Diabetes mellitus type 2 in obese ([place]) 03/11/2015   Morbid obesity ([place]) 03/11/2015    Past Surgical History:  Procedure Laterality Date   BARIATRIC SURGERY  06/23/2019   [facility] 347lbs prior to surgery.   BIOPSY  08/16/2020   Procedure: BIOPSY;  Surgeon: Eloise Harman, DO;  [facility];  Service: Endoscopy;;   COLONOSCOPY WITH PROPOFOL N/A 08/16/2020   Procedure: COLONOSCOPY WITH PROPOFOL;  Surgeon: Eloise Harman, DO;  [facility];  Service: Endoscopy;   Laterality: N/A;   ESOPHAGOGASTRODUODENOSCOPY (EGD) WITH PROPOFOL N/A 08/16/2020   Procedure: ESOPHAGOGASTRODUODENOSCOPY (EGD) WITH PROPOFOL;  Surgeon: Eloise Harman, DO;  [facility];  Service: Endoscopy;  Laterality: N/A;  8:30am   HERNIA REPAIR     POLYPECTOMY  08/16/2020   Procedure: POLYPECTOMY;  Surgeon: Eloise Harman, DO;  [facility];  Service: Endoscopy;;    OB History   No obstetric history on file.      Home Medications    Prior to Admission medications   Medication Sig Start Date End Date Taking? Authorizing Provider  ibuprofen (ADVIL) 800 MG tablet Take 1 tablet (800 mg total) by mouth every 8 (eight) hours as needed. 12/23/20  Yes Sharion Balloon, NP  methocarbamol (ROBAXIN) 500 MG tablet Take 1 tablet (500 mg total) by mouth 2 (two) times daily as needed for muscle spasms. 12/23/20  Yes Sharion Balloon, NP  albuterol (VENTOLIN HFA) 108 (90 Base) MCG/ACT inhaler Inhale 2 puffs into the lungs every 4 (four) hours as needed for wheezing or shortness of breath. 06/15/20   Doree Albee, MD  Cholecalciferol (VITAMIN D-3) 125 MCG (5000 UT) TABS Take 2 tablets by mouth daily.    [provider]  Multiple Vitamins-Minerals (BARIATRIC MULTIVITAMINS/IRON PO) Take 45 mg by mouth daily. 07/15/19   [provider]  NP THYROID 60 MG tablet Take 1 tablet (60 mg total) by mouth daily before breakfast. 11/23/20   Doree Albee, MD  omeprazole (PRILOSEC)  20 MG capsule Take 1 capsule (20 mg total) by mouth 2 (two) times daily before a meal. Take 30 min before breakfast and 30 min before dinner 08/16/20 02/12/21  Eloise Harman, DO  Semaglutide (RYBELSUS) 3 MG TABS Take 3 mg by mouth daily. 08/19/20   Doree Albee, MD    Family History Family History  Problem Relation Age of Onset   Heart disease Mother    Diabetes Mother    Hypertension Father    Diabetes Sister    Diabetes Brother     Social History Social History   Tobacco  Use   Smoking status: Never   Smokeless tobacco: Never  Vaping Use   Vaping Use: Never used  Substance Use Topics   Alcohol use: Yes    Comment: occasionally   Drug use: No     Allergies   Penicillins, Other, and Lisinopril   Review of Systems Review of Systems  Constitutional:  Negative for chills and fever.  Respiratory:  Negative for cough and shortness of breath.   Cardiovascular:  Negative for chest pain and palpitations.  Gastrointestinal:  Negative for abdominal pain and vomiting.  Genitourinary:  Positive for frequency. Negative for dysuria, hematuria, pelvic pain and vaginal discharge.  Musculoskeletal:  Positive for back pain. Negative for arthralgias.  Skin:  Negative for color change and rash.  Neurological:  Negative for weakness and numbness.  All other systems reviewed and are negative.   Physical Exam Triage Vital Signs ED Triage Vitals  Enc Vitals Group     BP 12/23/20 1123 (!) 153/87     Pulse Rate 12/23/20 1123 78     Resp 12/23/20 1123 18     Temp 12/23/20 1123 98.9 F (37.2 C)     Temp Source 12/23/20 1123 Oral     SpO2 12/23/20 1123 96 %     Weight --      Height --      Head Circumference --      Peak Flow --      Pain Score 12/23/20 1124 7     Pain Loc --      Pain Edu? --      Excl. in [place]? --    No data found.  Updated Vital Signs BP (!) 153/87 (BP Location: Left Arm)   Pulse 78   Temp 98.9 F (37.2 C) (Oral)   Resp 18   LMP 11/28/2020   SpO2 96%   Visual Acuity Right Eye Distance:   Left Eye Distance:   Bilateral Distance:    Right Eye Near:   Left Eye Near:    Bilateral Near:     Physical Exam Vitals and nursing note reviewed.  Constitutional:      General: She is not in acute distress.    Appearance: She is well-developed. She is obese. She is not ill-appearing.  HENT:     Head: Normocephalic and atraumatic.     Mouth/Throat:     Mouth: Mucous membranes are moist.  Eyes:     Conjunctiva/sclera: Conjunctivae  normal.  Cardiovascular:     Rate and Rhythm: Normal rate and regular rhythm.     Heart sounds: Normal heart sounds.  Pulmonary:     Effort: Pulmonary effort is normal. No respiratory distress.     Breath sounds: Normal breath sounds.  Abdominal:     General: Bowel sounds are normal.     Palpations: Abdomen is soft.     Tenderness: There is no  abdominal tenderness. There is no right CVA tenderness, left CVA tenderness, guarding or rebound.  Musculoskeletal:        General: No swelling or deformity. Normal range of motion.     Cervical back: Neck supple.  Skin:    General: Skin is warm and dry.  Neurological:     General: No focal deficit present.     Mental Status: She is alert and oriented to person, place, and time.     Gait: Gait normal.  Psychiatric:        Mood and Affect: Mood normal.        Behavior: Behavior normal.     UC Treatments / Results  Labs (all labs ordered are listed, but only abnormal results are displayed) Labs Reviewed  POCT FASTING CBG [facility] MANUAL ENTRY - Abnormal; Notable for the following components:      Result Value   POCT Glucose ([facility]) 129 (*)    All other components within normal limits  POCT URINALYSIS DIP (MANUAL ENTRY)    EKG   Radiology No results found.  Procedures Procedures (including critical care time)  Medications Ordered in UC Medications - No data to display  Initial Impression / Assessment and Plan / UC Course  I have reviewed the triage vital signs and the nursing notes.  Pertinent labs & imaging results that were available during my care of the patient were reviewed by me and considered in my medical decision making (see chart for details).  Urinary frequency, acute low back pain without sciatica, elevated blood pressure reading.  Urine normal.  CBG 129.  Instructed patient to follow-up with her PCP if the urinary frequency persists.  Treating low back pain with ibuprofen and methocarbamol.  Precautions for drowsiness  with methocarbamol discussed.  Also discussed that her blood pressure is elevated today and needs to be rechecked by her PCP in 2 to 4 weeks.  Education provided on preventing hypertension.  She agrees to plan of care.   Final Clinical Impressions(s) / UC Diagnoses   Final diagnoses:  Urinary frequency  Acute bilateral low back pain without sciatica  Elevated blood pressure reading     Discharge Instructions      Your urine does not show signs of infection.   Take ibuprofen as needed for discomfort.  Take the muscle relaxer as needed for muscle spasm; Do not drive, operate machinery, or drink alcohol with this medication as it can cause drowsiness.   Follow up with your primary care provider or an orthopedist if your symptoms are not improving.    Your blood pressure is elevated today at 153/87.  Please have this rechecked by your primary care provider in 2-4 weeks.          ED Prescriptions     Medication Sig Dispense Auth. Provider   ibuprofen (ADVIL) 800 MG tablet Take 1 tablet (800 mg total) by mouth every 8 (eight) hours as needed. 21 tablet Sharion Balloon, NP   methocarbamol (ROBAXIN) 500 MG tablet Take 1 tablet (500 mg total) by mouth 2 (two) times daily as needed for muscle spasms. 10 tablet Sharion Balloon, NP      PDMP not reviewed this encounter.   Sharion Balloon, NP 12/23/20 [phone]

## 2020-12-23 NOTE — ED Triage Notes (Signed)
Pt presents today with c/o of dysuria with lower back pain x 1 week. +urine frequency, urgency Denies vaginal discharge.

## 2020-12-23 NOTE — Discharge Instructions (Addendum)
Your urine does not show signs of infection.   Take ibuprofen as needed for discomfort.  Take the muscle relaxer as needed for muscle spasm; Do not drive, operate machinery, or drink alcohol with this medication as it can cause drowsiness.   Follow up with your primary care provider or an orthopedist if your symptoms are not improving.    Your blood pressure is elevated today at 153/87.  Please have this rechecked by your primary care provider in 2-4 weeks.

## 2020-12-24 ENCOUNTER — Ambulatory Visit: Payer: BC Managed Care – PPO

## 2020-12-29 ENCOUNTER — Other Ambulatory Visit: Payer: Self-pay

## 2020-12-29 ENCOUNTER — Ambulatory Visit: Payer: BC Managed Care – PPO | Admitting: Podiatry

## 2020-12-29 ENCOUNTER — Ambulatory Visit (INDEPENDENT_AMBULATORY_CARE_PROVIDER_SITE_OTHER): Payer: BC Managed Care – PPO

## 2020-12-29 DIAGNOSIS — M659 Synovitis and tenosynovitis, unspecified: Secondary | ICD-10-CM

## 2020-12-29 DIAGNOSIS — M778 Other enthesopathies, not elsewhere classified: Secondary | ICD-10-CM

## 2020-12-29 MED ORDER — METHYLPREDNISOLONE 4 MG PO TBPK: ORAL_TABLET | ORAL | 0 refills | Status: DC

## 2020-12-29 MED ORDER — MELOXICAM 15 MG PO TABS: 15.0000 mg | ORAL_TABLET | Freq: Every day | ORAL | 1 refills | Status: DC

## 2020-12-29 NOTE — Progress Notes (Signed)
   HPI: 45 y.o. female presenting today for evaluation of some tightness to the left ankle that extends down into the dorsum of the left foot.  Patient states that it feels uncomfortable throughout the day.  She experiences some burning and tingling also.  She presents for further treatment and evaluation  Past Medical History:  Diagnosis Date   Asthma    Bronchitis    Diabetes mellitus without complication ([place])    DVT (deep venous thrombosis) ([place])    Obesity      Physical Exam: General: The patient is alert and oriented x3 in no acute distress.  Dermatology: Skin is warm, dry and supple bilateral lower extremities. Negative for open lesions or macerations.  Vascular: Palpable pedal pulses bilaterally. No edema or erythema noted. Capillary refill within normal limits.  Neurological: Epicritic and protective threshold grossly intact bilaterally.   Musculoskeletal Exam: Range of motion within normal limits to all pedal and ankle joints bilateral. Muscle strength 5/5 in all groups bilateral.  There are some pain on palpation to the anterior aspect of the ankle as well as the soft tissue structures just proximal to the ankle joint.  The pain also extends down to the metatarsals.  Is very diffuse pain.  There is not 1 area of focal pain  Radiographic Exam Foot LT:  Normal osseous mineralization. Joint spaces preserved. No fracture/dislocation/boney destruction.    Assessment: 1.  Generalized foot and ankle pain left 2.  Possible capsulitis left foot and ankle   Plan of Care:  1. Patient evaluated. X-Rays reviewed.  2.  Prescription for Medrol Dosepak 3.  Prescription for meloxicam 15 mg daily after completion of the Dosepak 4.  Recommend good supportive shoes that are wide fitting in the toebox that do not constrict the toes 5.  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      Edrick Kins, DPM [facility]  Dr. Edrick Kins, DPM    [address], [place] [address]                Office [phone]  Fax [phone]

## 2020-12-29 NOTE — Progress Notes (Signed)
F

## 2021-01-31 ENCOUNTER — Ambulatory Visit: Payer: BC Managed Care – PPO | Admitting: Podiatry

## 2021-01-31 ENCOUNTER — Other Ambulatory Visit: Payer: Self-pay

## 2021-01-31 DIAGNOSIS — M778 Other enthesopathies, not elsewhere classified: Secondary | ICD-10-CM

## 2021-01-31 MED ORDER — BETAMETHASONE SOD PHOS & ACET 6 (3-3) MG/ML IJ SUSP
3.0000 mg | Freq: Once | INTRAMUSCULAR | Status: AC
  Administered 2021-01-31: 3 mg via INTRA_ARTICULAR

## 2021-01-31 NOTE — Progress Notes (Signed)
   HPI: 45 y.o. female presenting today for evaluation of some tightness to the left ankle that extends down into the dorsum of the left foot.  Patient states there has been no improvement or change since last visit.  Past Medical History:  Diagnosis Date   Asthma    Bronchitis    Diabetes mellitus without complication ([place])    DVT (deep venous thrombosis) ([place])    Obesity      Physical Exam: General: The patient is alert and oriented x3 in no acute distress.  Dermatology: Skin is warm, dry and supple bilateral lower extremities. Negative for open lesions or macerations.  Vascular: Palpable pedal pulses bilaterally. No edema or erythema noted. Capillary refill within normal limits.  Neurological: Epicritic and protective threshold grossly intact bilaterally.   Musculoskeletal Exam: Range of motion within normal limits to all pedal and ankle joints bilateral. Muscle strength 5/5 in all groups bilateral.  There are some pain on palpation to the anterior aspect of the ankle as well as the soft tissue structures just proximal to the ankle joint.  The pain also extends down to the metatarsals.  Is very diffuse pain.  There is not 1 area of focal pain  Radiographic Exam Foot LT 12/29/2020:  Normal osseous mineralization. Joint spaces preserved. No fracture/dislocation/boney destruction.    Assessment: 1.  Generalized foot and ankle pain left 2.  Possible capsulitis left foot and ankle   Plan of Care:  1. Patient evaluated.  2.  Decision was made today to provide steroid injection.  Injection of 0.5 cc Celestone Soluspan injected into the dorsum of the left midfoot 3.  The patient has had success with a cam boot however as soon as she discontinued the cam boot the pain eventually recurred.  Resume cam boot in the meantime for now.  Weightbearing as tolerated.  A new cam boot was provided 4.  The patient has not had this pain intermittently for several months.  She has failed conservative  treatment.  Today were going to order an MRI left foot.  Suspect possible stress fracture 5.  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      Edrick Kins, DPM [facility]  Dr. Edrick Kins, DPM    [address], [place] [address]                Office [phone]  Fax [phone]

## 2021-02-13 ENCOUNTER — Ambulatory Visit
Admission: RE | Admit: 2021-02-13 | Discharge: 2021-02-13 | Disposition: A | Discharge status: Home / Self Care | Payer: BC Managed Care – PPO | Source: Ambulatory Visit | Attending: Podiatry | Admitting: Podiatry

## 2021-02-13 ENCOUNTER — Other Ambulatory Visit: Payer: Self-pay

## 2021-02-13 DIAGNOSIS — M778 Other enthesopathies, not elsewhere classified: Secondary | ICD-10-CM

## 2022-01-02 ENCOUNTER — Observation Stay (HOSPITAL_COMMUNITY)
Admission: EM | Admit: 2022-01-02 | Discharge: 2022-01-05 | Disposition: A | Discharge status: Home / Self Care | Payer: BC Managed Care – PPO | Attending: Internal Medicine | Admitting: Internal Medicine

## 2022-01-02 ENCOUNTER — Emergency Department (HOSPITAL_COMMUNITY): Payer: BC Managed Care – PPO

## 2022-01-02 ENCOUNTER — Encounter (HOSPITAL_COMMUNITY): Payer: Self-pay

## 2022-01-02 ENCOUNTER — Other Ambulatory Visit: Payer: Self-pay

## 2022-01-02 DIAGNOSIS — E119 Type 2 diabetes mellitus without complications: Secondary | ICD-10-CM | POA: Diagnosis not present

## 2022-01-02 DIAGNOSIS — Z7985 Long-term (current) use of injectable non-insulin antidiabetic drugs: Secondary | ICD-10-CM | POA: Diagnosis not present

## 2022-01-02 DIAGNOSIS — Z79899 Other long term (current) drug therapy: Secondary | ICD-10-CM | POA: Insufficient documentation

## 2022-01-02 DIAGNOSIS — B957 Other staphylococcus as the cause of diseases classified elsewhere: Secondary | ICD-10-CM | POA: Diagnosis not present

## 2022-01-02 DIAGNOSIS — J45909 Unspecified asthma, uncomplicated: Secondary | ICD-10-CM | POA: Insufficient documentation

## 2022-01-02 DIAGNOSIS — J9601 Acute respiratory failure with hypoxia: Principal | ICD-10-CM | POA: Diagnosis present

## 2022-01-02 DIAGNOSIS — M21961 Unspecified acquired deformity of right lower leg: Secondary | ICD-10-CM | POA: Diagnosis present

## 2022-01-02 DIAGNOSIS — I5189 Other ill-defined heart diseases: Secondary | ICD-10-CM | POA: Insufficient documentation

## 2022-01-02 DIAGNOSIS — Z20822 Contact with and (suspected) exposure to covid-19: Secondary | ICD-10-CM | POA: Insufficient documentation

## 2022-01-02 DIAGNOSIS — J189 Pneumonia, unspecified organism: Secondary | ICD-10-CM | POA: Diagnosis not present

## 2022-01-02 DIAGNOSIS — Z86718 Personal history of other venous thrombosis and embolism: Secondary | ICD-10-CM | POA: Insufficient documentation

## 2022-01-02 DIAGNOSIS — E1169 Type 2 diabetes mellitus with other specified complication: Secondary | ICD-10-CM | POA: Diagnosis present

## 2022-01-02 DIAGNOSIS — R0602 Shortness of breath: Secondary | ICD-10-CM | POA: Diagnosis present

## 2022-01-02 MED ORDER — ALBUTEROL SULFATE HFA 108 (90 BASE) MCG/ACT IN AERS
2.0000 | INHALATION_SPRAY | RESPIRATORY_TRACT | Status: DC | PRN
  Administered 2022-01-02: 2 via RESPIRATORY_TRACT
  Filled 2022-01-02: qty 6.7

## 2022-01-02 NOTE — ED Triage Notes (Signed)
Per EMS patient called 911 due to [facility] at home. Patient found to have spo2 of 80%. Patient has hx of asthma and anxiety. Per EMS patient's lungs are clear in all quadrants. Patient reports having coughed up sputum while at home. Patient reports swelling of BLE going on for a while but not seeing anyone for it. Patient states that she has had a panic attack before. Patient reports that she was exercising when the [facility] began.

## 2022-01-02 NOTE — ED Notes (Signed)
Patient transported to X-ray 

## 2022-01-02 NOTE — ED Triage Notes (Signed)
Patient reports that she hasn't been taken her wellbutrin as prescribed

## 2022-01-02 NOTE — ED Provider Notes (Signed)
Emergency Department Provider Note   I have reviewed the triage vital signs and the nursing notes.   HISTORY  Chief Complaint Shortness of Breath   HPI Marie Spears is a 46 y.o. female with past history of diabetes and elevated BMI presents to the emergency department with shortness of breath.  She describes symptoms starting around 6 PM.  She called EMS who reported O2 sat to 80% on room air.  She was started on oxygen in route but this was weaned on her way to the hospital.  She continues to feel some shortness of breath and notes some productive cough with yellow sputum.  No hemoptysis.  She not having chest pain or tightness.  She notes a prior history of asthma but does not use albuterol regularly or within the past year. No fever or chills. She has noticed some ankle swelling in the last several days.   Past Medical History:  Diagnosis Date   Asthma    Bronchitis    Diabetes mellitus without complication (HCC)    DVT (deep venous thrombosis) (HCC)    Obesity     Review of Systems  Constitutional: No fever/chills Eyes: No visual changes. ENT: No sore throat. Cardiovascular: Denies chest pain. Respiratory: Positive shortness of breath. Gastrointestinal: No abdominal pain.  No nausea, no vomiting.  No diarrhea.  No constipation. Genitourinary: Negative for dysuria. Musculoskeletal: Negative for back pain. Skin: Negative for rash. Neurological: Negative for headaches, focal weakness or numbness.  ____________________________________________   PHYSICAL EXAM:  VITAL SIGNS: ED Triage Vitals  Enc Vitals Group     BP 01/02/22 2236 (!) 158/83     Pulse Rate 01/02/22 2236 85     Resp 01/02/22 2245 18     Temp --      Temp src --      SpO2 01/02/22 2236 100 %   Constitutional: Alert and oriented. Well appearing and in no acute distress. Eyes: Conjunctivae are normal.  Head: Atraumatic. Nose: No congestion/rhinnorhea. Mouth/Throat: Mucous membranes are  moist.  Neck: No stridor.  Cardiovascular: Normal rate, regular rhythm. Good peripheral circulation. Grossly normal heart sounds.   Respiratory: Increased respiratory effort with ambulation.  No retractions. Lungs with rhonchi bilaterally. No wheezing.  Gastrointestinal: Soft and nontender. No distention.  {Musculoskeletal: No lower extremity tenderness with trace bilateral LE dema. No gross deformities of extremities. Neurologic:  Normal speech and language. No gross focal neurologic deficits are appreciated.  Skin:  Skin is warm, dry and intact. No rash noted.   ____________________________________________   LABS (all labs ordered are listed, but only abnormal results are displayed)  Labs Reviewed  CBC WITH DIFFERENTIAL/PLATELET - Abnormal; Notable for the following components:      Result Value   Hemoglobin 10.7 (*)    HCT 33.3 (*)    All other components within normal limits  BASIC METABOLIC PANEL - Abnormal; Notable for the following components:   Potassium 3.4 (*)    Glucose, Bld 109 (*)    Calcium 7.8 (*)    All other components within normal limits  HEPATIC FUNCTION PANEL - Abnormal; Notable for the following components:   Total Protein 5.6 (*)    Albumin 3.0 (*)    All other components within normal limits  TROPONIN I (HIGH SENSITIVITY) - Abnormal; Notable for the following components:   Troponin I (High Sensitivity) 22 (*)    All other components within normal limits  RESP PANEL BY RT-PCR (FLU A&B, COVID) ARPGX2  CULTURE,  BLOOD (ROUTINE X 2)  CULTURE, BLOOD (ROUTINE X 2)  BRAIN NATRIURETIC PEPTIDE  LIPASE, BLOOD  PROCALCITONIN  HIV ANTIBODY (ROUTINE TESTING W REFLEX)  CBC  BASIC METABOLIC PANEL  STREP PNEUMONIAE URINARY ANTIGEN  LEGIONELLA PNEUMOPHILA SEROGP 1 UR AG  TROPONIN I (HIGH SENSITIVITY)   ____________________________________________  RADIOLOGY  DG Chest 2 View  Result Date: 01/02/2022 CLINICAL DATA:  New onset shortness of breath. EXAM: CHEST -  2 VIEW COMPARISON:  Chest x-ray 06/29/2019 FINDINGS: There is diffuse bilateral multifocal airspace disease, right greater than left. Cardiomediastinal silhouette within normal limits. No pleural effusion or pneumothorax. No acute fractures. IMPRESSION: Diffuse bilateral multifocal airspace disease worrisome for infection. Follow-up chest x-ray recommended in 4-6 weeks to confirm resolution. Electronically Signed   By: Ronney Asters M.D.   On: 01/02/2022 23:10    ____________________________________________   PROCEDURES  Procedure(s) performed:   .Critical Care  Performed by: Margette Fast, MD Authorized by: Margette Fast, MD   Critical care provider statement:    Critical care time (minutes):  35   Critical care time was exclusive of:  Separately billable procedures and treating other patients and teaching time   Critical care was necessary to treat or prevent imminent or life-threatening deterioration of the following conditions:  Respiratory failure   Critical care was time spent personally by me on the following activities:  Development of treatment plan with patient or surrogate, discussions with consultants, evaluation of patient's response to treatment, examination of patient, ordering and review of laboratory studies, ordering and review of radiographic studies, ordering and performing treatments and interventions, pulse oximetry, re-evaluation of patient's condition, review of old charts and obtaining history from patient or surrogate   I assumed direction of critical care for this patient from another provider in my specialty: no     Care discussed with: admitting provider      ____________________________________________   INITIAL IMPRESSION / [place] / ED COURSE  Pertinent labs & imaging results that were available during my care of the patient were reviewed by me and considered in my medical decision making (see chart for details).   This patient is Presenting  for Evaluation of SOB, which does require a range of treatment options, and is a complaint that involves a high risk of morbidity and mortality.  The Differential Diagnoses include CAP, COVID, Flu, pulmonary edema.  Critical Interventions-    Medications  albuterol (VENTOLIN HFA) 108 (90 Base) MCG/ACT inhaler 2 puff (2 puffs Inhalation Given 01/02/22 2324)  cefTRIAXone (ROCEPHIN) 1 g in sodium chloride 0.9 % 100 mL IVPB (1 g Intravenous New Bag/Given 01/03/22 0136)  azithromycin (ZITHROMAX) 500 mg in sodium chloride 0.9 % 250 mL IVPB (has no administration in time range)  enoxaparin (LOVENOX) injection 40 mg (has no administration in time range)  cefTRIAXone (ROCEPHIN) 2 g in sodium chloride 0.9 % 100 mL IVPB (has no administration in time range)  azithromycin (ZITHROMAX) 500 mg in sodium chloride 0.9 % 250 mL IVPB (has no administration in time range)  acetaminophen (TYLENOL) tablet 650 mg (has no administration in time range)    Reassessment after intervention:  Patient feeling improved but hypoxemic to 88% on RA with ambulation.    I did obtain Additional Historical Information from family at bedside.  I decided to review pertinent External Data, and in summary no prior ECHO for review. No left heart cath.   Clinical Laboratory Tests Ordered, included mild troponin elevation 21.  Normal BNP.  Creatinine  normal.  COVID and flu negative.  Radiologic Tests Ordered, included CXR. I independently interpreted the images and agree with radiology interpretation.   Cardiac Monitor Tracing which shows NSR.   Social Determinants of Health Risk patient is a non-smoker.   Consult complete with Hospitalist, Dr. Alcario Drought, plan for admit.   Medical Decision Making: Summary:  Patient presents to the ED with SOB and hypoxemia with ambulation.  Found to have multifocal pneumonia on chest x-ray.  Afebrile.  Exam not consistent with pulmonary edema.  Plan to start antibiotics.  Patient has tolerated  Keflex in the past despite her listed penicillin allergy.   Disposition: admit  ____________________________________________  FINAL CLINICAL IMPRESSION(S) / ED DIAGNOSES  Final diagnoses:  Multifocal pneumonia     Note:  This document was prepared using Dragon voice recognition software and may include unintentional dictation errors.  Nanda Quinton, MD, [facility] Emergency Medicine    Eriko Economos, [facility], MD 01/03/22 [phone]

## 2022-01-03 ENCOUNTER — Observation Stay (HOSPITAL_COMMUNITY): Payer: BC Managed Care – PPO

## 2022-01-03 DIAGNOSIS — E669 Obesity, unspecified: Secondary | ICD-10-CM

## 2022-01-03 DIAGNOSIS — E1169 Type 2 diabetes mellitus with other specified complication: Secondary | ICD-10-CM

## 2022-01-03 DIAGNOSIS — J189 Pneumonia, unspecified organism: Secondary | ICD-10-CM | POA: Diagnosis not present

## 2022-01-03 DIAGNOSIS — J9601 Acute respiratory failure with hypoxia: Secondary | ICD-10-CM | POA: Diagnosis present

## 2022-01-03 DIAGNOSIS — M21961 Unspecified acquired deformity of right lower leg: Secondary | ICD-10-CM | POA: Diagnosis present

## 2022-01-03 LAB — CBC WITH DIFFERENTIAL/PLATELET
Abs Immature Granulocytes: 0.01 10*3/uL (ref 0.00–0.07)
Basophils Absolute: 0 10*3/uL (ref 0.0–0.1)
Basophils Relative: 1 %
Eosinophils Absolute: 0.1 10*3/uL (ref 0.0–0.5)
Eosinophils Relative: 1 %
HCT: 33.3 % — ABNORMAL LOW (ref 36.0–46.0)
Hemoglobin: 10.7 g/dL — ABNORMAL LOW (ref 12.0–15.0)
Immature Granulocytes: 0 %
Lymphocytes Relative: 16 %
Lymphs Abs: 1 10*3/uL (ref 0.7–4.0)
MCH: 26.8 pg (ref 26.0–34.0)
MCHC: 32.1 g/dL (ref 30.0–36.0)
MCV: 83.3 fL (ref 80.0–100.0)
Monocytes Absolute: 0.6 10*3/uL (ref 0.1–1.0)
Monocytes Relative: 10 %
Neutro Abs: 4.7 10*3/uL (ref 1.7–7.7)
Neutrophils Relative %: 72 %
Platelets: 195 10*3/uL (ref 150–400)
RBC: 4 MIL/uL (ref 3.87–5.11)
RDW: 14.6 % (ref 11.5–15.5)
WBC: 6.5 10*3/uL (ref 4.0–10.5)
nRBC: 0 % (ref 0.0–0.2)

## 2022-01-03 LAB — HIV ANTIBODY (ROUTINE TESTING W REFLEX): HIV Screen 4th Generation wRfx: NONREACTIVE

## 2022-01-03 LAB — HEPATIC FUNCTION PANEL
ALT: 24 U/L (ref 0–44)
AST: 24 U/L (ref 15–41)
Albumin: 3 g/dL — ABNORMAL LOW (ref 3.5–5.0)
Alkaline Phosphatase: 49 U/L (ref 38–126)
Bilirubin, Direct: <0.1 mg/dL (ref 0.0–0.2)
Total Bilirubin: 0.4 mg/dL (ref 0.3–1.2)
Total Protein: 5.6 g/dL — ABNORMAL LOW (ref 6.5–8.1)

## 2022-01-03 LAB — RESP PANEL BY RT-PCR (FLU A&B, COVID) ARPGX2
Influenza A by PCR: NEGATIVE
Influenza B by PCR: NEGATIVE
SARS Coronavirus 2 by RT PCR: NEGATIVE

## 2022-01-03 LAB — BASIC METABOLIC PANEL
Anion gap: 7 (ref 5–15)
Anion gap: 9 (ref 5–15)
BUN: 10 mg/dL (ref 6–20)
BUN: 10 mg/dL (ref 6–20)
CO2: 23 mmol/L (ref 22–32)
CO2: 25 mmol/L (ref 22–32)
Calcium: 7.8 mg/dL — ABNORMAL LOW (ref 8.9–10.3)
Calcium: 8.7 mg/dL — ABNORMAL LOW (ref 8.9–10.3)
Chloride: 110 mmol/L (ref 98–111)
Chloride: 107 mmol/L (ref 98–111)
Creatinine, Ser: 0.84 mg/dL (ref 0.44–1.00)
Creatinine, Ser: 0.85 mg/dL (ref 0.44–1.00)
GFR, Estimated: >60 mL/min (ref >60)
GFR, Estimated: >60 mL/min (ref >60)
Glucose, Bld: 109 mg/dL — ABNORMAL HIGH (ref 70–99)
Glucose, Bld: 160 mg/dL — ABNORMAL HIGH (ref 70–99)
Potassium: 3.4 mmol/L — ABNORMAL LOW (ref 3.5–5.1)
Potassium: 3.9 mmol/L (ref 3.5–5.1)
Sodium: 140 mmol/L (ref 135–145)
Sodium: 141 mmol/L (ref 135–145)

## 2022-01-03 LAB — BRAIN NATRIURETIC PEPTIDE: B Natriuretic Peptide: 55 pg/mL (ref 0.0–100.0)

## 2022-01-03 LAB — TROPONIN I (HIGH SENSITIVITY)
Troponin I (High Sensitivity): 22 ng/L — ABNORMAL HIGH (ref <18)
Troponin I (High Sensitivity): 21 ng/L — ABNORMAL HIGH (ref <18)

## 2022-01-03 LAB — CBC
HCT: 31.2 % — ABNORMAL LOW (ref 36.0–46.0)
Hemoglobin: 9.9 g/dL — ABNORMAL LOW (ref 12.0–15.0)
MCH: 26.6 pg (ref 26.0–34.0)
MCHC: 31.7 g/dL (ref 30.0–36.0)
MCV: 83.9 fL (ref 80.0–100.0)
Platelets: 194 10*3/uL (ref 150–400)
RBC: 3.72 MIL/uL — ABNORMAL LOW (ref 3.87–5.11)
RDW: 14.6 % (ref 11.5–15.5)
WBC: 5.8 10*3/uL (ref 4.0–10.5)
nRBC: 0 % (ref 0.0–0.2)

## 2022-01-03 LAB — PROCALCITONIN: Procalcitonin: <0.1 ng/mL

## 2022-01-03 LAB — STREP PNEUMONIAE URINARY ANTIGEN: Strep Pneumo Urinary Antigen: NEGATIVE

## 2022-01-03 LAB — LIPASE, BLOOD: Lipase: 25 U/L (ref 11–51)

## 2022-01-03 LAB — CBG MONITORING, ED
Glucose-Capillary: 122 mg/dL — ABNORMAL HIGH (ref 70–99)
Glucose-Capillary: 154 mg/dL — ABNORMAL HIGH (ref 70–99)

## 2022-01-03 MED ORDER — LEVOFLOXACIN IN D5W 750 MG/150ML IV SOLN: 750.0000 mg | Freq: Once | INTRAVENOUS | Status: DC

## 2022-01-03 MED ORDER — SODIUM CHLORIDE 0.9 % IV SOLN
2.0000 g | INTRAVENOUS | Status: DC
  Administered 2022-01-04 – 2022-01-05 (×2): 2 g via INTRAVENOUS
  Filled 2022-01-03 (×3): qty 20

## 2022-01-03 MED ORDER — ENOXAPARIN SODIUM 40 MG/0.4ML IJ SOSY
40.0000 mg | PREFILLED_SYRINGE | INTRAMUSCULAR | Status: DC
Start: 2022-01-03 — End: 2022-01-05
  Administered 2022-01-03 – 2022-01-05 (×3): 40 mg via SUBCUTANEOUS
  Filled 2022-01-03 (×3): qty 0.4

## 2022-01-03 MED ORDER — ALBUTEROL SULFATE (2.5 MG/3ML) 0.083% IN NEBU: 2.5000 mg | INHALATION_SOLUTION | RESPIRATORY_TRACT | Status: DC | PRN

## 2022-01-03 MED ORDER — SODIUM CHLORIDE 0.9 % IV SOLN
1.0000 g | Freq: Once | INTRAVENOUS | Status: AC
  Administered 2022-01-03: 1 g via INTRAVENOUS
  Filled 2022-01-03: qty 10

## 2022-01-03 MED ORDER — SODIUM CHLORIDE 0.9 % IV SOLN
500.0000 mg | INTRAVENOUS | Status: DC
  Administered 2022-01-04 – 2022-01-05 (×2): 500 mg via INTRAVENOUS
  Filled 2022-01-03 (×2): qty 5

## 2022-01-03 MED ORDER — SODIUM CHLORIDE 0.9 % IV SOLN
500.0000 mg | Freq: Once | INTRAVENOUS | Status: AC
  Administered 2022-01-03: 500 mg via INTRAVENOUS
  Filled 2022-01-03: qty 5

## 2022-01-03 MED ORDER — ACETAMINOPHEN 325 MG PO TABS
650.0000 mg | ORAL_TABLET | Freq: Four times a day (QID) | ORAL | Status: DC | PRN
  Administered 2022-01-03: 650 mg via ORAL
  Filled 2022-01-03: qty 2

## 2022-01-03 NOTE — Plan of Care (Signed)

## 2022-01-03 NOTE — Assessment & Plan Note (Addendum)
Chronic and long standing

## 2022-01-03 NOTE — Assessment & Plan Note (Addendum)
Currently "well controlled" per pt with just diet. Will check CBGs AC/HS for now.

## 2022-01-03 NOTE — H&P (Signed)
History and Physical    Patient: Marie Spears EHM:[id number] DOB: Jan 09, 1976 DOA: 01/02/2022 DOS: the patient was seen and examined on 01/03/2022 PCP: Doree Albee, MD (Inactive)  Patient coming from: Home  Chief Complaint:  Chief Complaint  Patient presents with   Shortness of Breath   HPI: Marie Spears is a 46 y.o. female with medical history significant of morbid obesity, DM diet controlled.  Pt presents to ED with c/o SOB.  Symptoms onset around 6PM.  Associated cough productive of yellow sputum.  No hemoptysis.  Does have intermittent leg swelling that is more long standing.  No CP.  No fevers nor chills.  Asthma but hasnt had to use rescue inhaler in past year.   Review of Systems: As mentioned in the history of present illness. All other systems reviewed and are negative. Past Medical History:  Diagnosis Date   Asthma    Bronchitis    Diabetes mellitus without complication ([place])    DVT (deep venous thrombosis) ([place])    Obesity    Past Surgical History:  Procedure Laterality Date   BARIATRIC SURGERY  06/23/2019   [facility] 347lbs prior to surgery.   BIOPSY  08/16/2020   Procedure: BIOPSY;  Surgeon: Eloise Harman, DO;  [facility];  Service: Endoscopy;;   COLONOSCOPY WITH PROPOFOL N/A 08/16/2020   Procedure: COLONOSCOPY WITH PROPOFOL;  Surgeon: Eloise Harman, DO;  [facility];  Service: Endoscopy;  Laterality: N/A;   ESOPHAGOGASTRODUODENOSCOPY (EGD) WITH PROPOFOL N/A 08/16/2020   Procedure: ESOPHAGOGASTRODUODENOSCOPY (EGD) WITH PROPOFOL;  Surgeon: Eloise Harman, DO;  Location[facility];  Service: Endoscopy;  Laterality: N/A;  8:30am   HERNIA REPAIR     POLYPECTOMY  08/16/2020   Procedure: POLYPECTOMY;  Surgeon: Eloise Harman, DO;  Location: [facility];  Service: Endoscopy;;   Social History:  reports that she has never smoked. She has never used smokeless tobacco. She reports current alcohol use. She reports  that she does not use drugs.  Allergies  Allergen Reactions   Penicillins Itching and Rash    Itching Has patient had a PCN reaction causing immediate rash, facial/tongue/throat swelling, SOB or lightheadedness with hypotension:YES Has patient had a PCN reaction causing severe rash involving mucus membranes or skin necrosis: NO Has patient had a PCN reaction that required hospitalization NO Has patient had a PCN reaction occurring within the last 10 years: NO If all of the above answers are "NO", then may proceed with Cephalosporin use.   Itching Has patient had a PCN reaction causing immediate rash, facial/tongue/throat swelling, SOB or lightheadedness with hypotension:YES Has patient had a PCN reaction causing severe rash involving mucus membranes or skin necrosis: NO Has patient had a PCN reaction that required hospitalization NO Has patient had a PCN reaction occurring within the last 10 years: NO If all of the above answers are "NO", then may proceed with Cephalosporin use. Causes itching  Itching Has patient had a PCN reaction causing immediate rash, facial/tongue/throat swelling, SOB or lightheadedness with hypotension:YES Has patient had a PCN reaction causing severe rash involving mucus membranes or skin necrosis: NO Has patient had a PCN reaction that required hospitalization NO Has patient had a PCN reaction occurring within the last 10 years: NO If all of the above answers are "NO", then may proceed with Cephalosporin use. Causes itching Itching Has patient had a PCN reaction causing immediate rash, facial/tongue/throat swelling, SOB or lightheadedness with hypotension:YES Has patient had a  PCN reaction causing severe rash involving mucus membranes or skin necrosis: NO Has patient had a PCN reaction that required hospitalization NO Has patient had a PCN reaction occurring within the last 10 years: NO If all of the above answers are "NO", then may proceed with Cephalosporin  use. Causes itching   Other Cough    Family History  Problem Relation Age of Onset   Heart disease Mother    Diabetes Mother    Hypertension Father    Diabetes Sister    Diabetes Brother     Prior to Admission medications   Medication Sig Start Date End Date Taking? Authorizing Provider  albuterol (VENTOLIN HFA) 108 (90 Base) MCG/ACT inhaler Inhale 2 puffs into the lungs every 4 (four) hours as needed for wheezing or shortness of breath. 06/15/20   Doree Albee, MD  Cholecalciferol (VITAMIN D-3) 125 MCG (5000 UT) TABS Take 2 tablets by mouth daily.    [provider]  Cholecalciferol 1.25 MG (50000 UT) capsule cholecalciferol (vitamin D3) 1,250 mcg (50,000 unit) capsule    [provider]  doxycycline (VIBRAMYCIN) 100 MG capsule doxycycline hyclate 100 mg capsule    [provider]  ibuprofen (ADVIL) 800 MG tablet Take 1 tablet (800 mg total) by mouth every 8 (eight) hours as needed. 12/23/20   Sharion Balloon, NP  meloxicam (MOBIC) 15 MG tablet Take 1 tablet (15 mg total) by mouth daily. 12/29/20   Edrick Kins, DPM  methocarbamol (ROBAXIN) 500 MG tablet Take 1 tablet (500 mg total) by mouth 2 (two) times daily as needed for muscle spasms. 12/23/20   Sharion Balloon, NP  methylPREDNISolone (MEDROL DOSEPAK) 4 MG TBPK tablet 6 day dose pack - take as directed 12/29/20   Edrick Kins, DPM  Multiple Vitamins-Minerals (BARIATRIC MULTIVITAMINS/IRON PO) Take 45 mg by mouth daily. 07/15/19   [provider]  NP THYROID 60 MG tablet Take 1 tablet (60 mg total) by mouth daily before breakfast. 11/23/20   Doree Albee, MD  omeprazole (PRILOSEC) 20 MG capsule Take 1 capsule (20 mg total) by mouth 2 (two) times daily before a meal. Take 30 min before breakfast and 30 min before dinner 08/16/20 02/12/21  Eloise Harman, DO  Semaglutide (RYBELSUS) 3 MG TABS Take 3 mg by mouth daily. 08/19/20   Doree Albee, MD    Physical Exam: Vitals:   01/03/22 0141  01/03/22 0215 01/03/22 0230 01/03/22 0245  BP:  (!) 130/113 116/81 126/87  Pulse:  88 87 98  Resp:  18 (!) 23 (!) 21  Temp: 98.6 F (37 C)     SpO2:  97% 97% 94%   Constitutional: Uncomfortable Eyes: PERRL, lids and conjunctivae normal ENMT: Mucous membranes are moist. Posterior pharynx clear of any exudate or lesions.Normal dentition.  Neck: normal, supple, no masses, no thyromegaly Respiratory: Dyspnic, especially with movement, increased WOB. Cardiovascular: Regular rate and rhythm, no murmurs / rubs / gallops. No extremity edema. 2+ pedal pulses. No carotid bruits.  Abdomen: no tenderness, no masses palpated. No hepatosplenomegaly. Bowel sounds positive.  Musculoskeletal: no clubbing / cyanosis. No joint deformity upper and lower extremities. Good ROM, no contractures. Normal muscle tone.  Skin: no rashes, lesions, ulcers. No induration Neurologic: CN 2-12 grossly intact. Sensation intact, DTR normal. Strength 5/5 in all 4.  Psychiatric: Normal judgment and insight. Alert and oriented x 3. Tearful mood.   Data Reviewed:     CBC    Component Value Date/Time  WBC 5.8 01/03/2022 0239   RBC 3.72 (L) 01/03/2022 0239   HGB 9.9 (L) 01/03/2022 0239   HCT 31.2 (L) 01/03/2022 0239   PLT 194 01/03/2022 0239   MCV 83.9 01/03/2022 0239   MCH 26.6 01/03/2022 0239   MCHC 31.7 01/03/2022 0239   RDW 14.6 01/03/2022 0239   LYMPHSABS 1.0 01/02/2022 2318   MONOABS 0.6 01/02/2022 2318   EOSABS 0.1 01/02/2022 2318   BASOSABS 0.0 01/02/2022 2318      Latest Ref Rng & Units 01/03/2022    2:39 AM 01/02/2022   11:18 PM 08/19/2020   12:00 AM  BMP  Glucose 70 - 99 mg/dL 160  109  94   BUN 6 - 20 mg/dL '10  10  18   '$ Creatinine 0.44 - 1.00 mg/dL 0.85  0.84  0.90   BUN/Creat Ratio 6 - 22 (calc)   NOT APPLICABLE   Sodium 191 - 145 mmol/L 141  140  141   Potassium 3.5 - 5.1 mmol/L 3.9  3.4  3.9   Chloride 98 - 111 mmol/L 107  110  106   CO2 22 - 32 mmol/L '25  23  30   '$ Calcium 8.9 - 10.3 mg/dL  8.7  7.8  9.4    CXR showing multifocal PNA  BNP 55  Trop 22 and 21  Assessment and Plan: * Acute respiratory failure with hypoxia (HCC) Patient has acute respiratory failure with hypoxia due to having a new oxygen requirement.  That is the patient has a PaO2 < 60 (pulse Ox < 90%) on room air. Appears to have multifocal PNA, no SIRS though BNP nl.  Does have peripheral edema for some time. Despite pcn allergy, seems to be tolerating rocephin in ED. PNA pathway Rocephin / azithro Check procalcitonin Repeat CBC/BMP in AM Urine for s.pneumo and legionella antigens COVID negative  Multifocal pneumonia Treatment as above  Right ankle joint deformity No acute erythema, swelling, or skin changes. But looks suspicious to me for possible charcot foot on the R side. Ordering X ray series of R foot.  Morbid obesity ([place]) Chronic and long standing  Diabetes mellitus type 2 in obese [facility]) Currently "well controlled" per pt with just diet. Will check CBGs AC/HS for now.      Advance Care Planning:   Code Status: Full Code  Consults: None  Family Communication: No family in room  Severity of Illness: The appropriate patient status for this patient is OBSERVATION. Observation status is judged to be reasonable and necessary in order to provide the required intensity of service to ensure the patient's safety. The patient's presenting symptoms, physical exam findings, and initial radiographic and laboratory data in the context of their medical condition is felt to place them at decreased risk for further clinical deterioration. Furthermore, it is anticipated that the patient will be medically stable for discharge from the hospital within 2 midnights of admission.   Author: Etta Quill., DO 01/03/2022 3:21 AM  For on call review [email or web address].

## 2022-01-03 NOTE — Assessment & Plan Note (Addendum)
Patient has acute respiratory failure with hypoxia due to having a new oxygen requirement.  That is the patient has a PaO2 < 60 (pulse Ox < 90%) on room air. Appears to have multifocal PNA, no SIRS though BNP nl.  Does have peripheral edema for some time. Despite pcn allergy, seems to be tolerating rocephin in ED. 1. PNA pathway 2. Rocephin / azithro 3. Check procalcitonin 4. Repeat CBC/BMP in AM 5. Urine for s.pneumo and legionella antigens 6. COVID negative

## 2022-01-03 NOTE — ED Notes (Signed)
Patient reminded of the need for a urine sample at this time; patient reports that she will try when she feels she is able. Patient provided with a cup of water.

## 2022-01-03 NOTE — Assessment & Plan Note (Signed)
No acute erythema, swelling, or skin changes. But looks suspicious to me for possible charcot foot on the R side. Ordering X ray series of R foot.

## 2022-01-03 NOTE — ED Notes (Signed)
Provider notified of the patient's right ankle pain and concern for bilateral ankle swelling

## 2022-01-03 NOTE — Assessment & Plan Note (Signed)
Treatment as above 

## 2022-01-03 NOTE — ED Notes (Signed)
Sp02 88-90% on room air during ambulation

## 2022-01-03 NOTE — ED Notes (Signed)
Pt asked this nurse for items to wash up with. Pt declines full shower, just wants items to wash self. This nurse got wash basin, shampoo, deodorant, toothbrush and toothpaste for pt. Gave pt washcloths and towels and new gown

## 2022-01-03 NOTE — Progress Notes (Signed)
46 year old female with history of morbid obesity, diet-controlled diabetes, previous history of gastric sleeve presented with quite sudden onset of shortness of breath, cough with yellowish sputum production.  No fever or chills.  Does have a history of asthma.  No exposure.  No recent travel.  In the emergency room initially on 2 L oxygen, chest x-ray consistent with bilateral multifocal disease, likely multifocal pneumonia.  Seen and examined.  Admitted early morning hours by nighttime hospitalist.  Quite sudden onset of disease to explain multifocal pneumonia without obvious fever.  Continue Rocephin and azithromycin, bronchodilator therapy.  Can go to MedSurg bed.  Mobilize.  Incentive spirometry. Legionella, streptococcal pneumonia antigen pending.  Procalcitonin less than 0.1.  Her clinical picture not consistent with severe disease seen in the chest x-ray.  We will do CT scan of the chest to rule out other possibilities including pulmonary edema.  If positive for pulmonary edema we will check echocardiogram.   Same-day admit.  No charge visit.

## 2022-01-03 NOTE — ED Notes (Signed)
This RN requests a urine sample from the patient at this time

## 2022-01-04 ENCOUNTER — Encounter (HOSPITAL_COMMUNITY): Payer: Self-pay | Admitting: Internal Medicine

## 2022-01-04 DIAGNOSIS — J9601 Acute respiratory failure with hypoxia: Secondary | ICD-10-CM | POA: Diagnosis not present

## 2022-01-04 LAB — CULTURE, BLOOD (ROUTINE X 2): Special Requests: ADEQUATE

## 2022-01-04 LAB — BLOOD CULTURE ID PANEL (REFLEXED) - BCID2

## 2022-01-04 LAB — LEGIONELLA PNEUMOPHILA SEROGP 1 UR AG: L. pneumophila Serogp 1 Ur Ag: NEGATIVE

## 2022-01-04 LAB — GLUCOSE, CAPILLARY: Glucose-Capillary: 169 mg/dL — ABNORMAL HIGH (ref 70–99)

## 2022-01-04 NOTE — Progress Notes (Signed)
PHARMACY - PHYSICIAN COMMUNICATION CRITICAL VALUE ALERT - BLOOD CULTURE IDENTIFICATION (BCID)  Marie Spears is an 45 y.o. female who presented to [facility] on 01/02/2022 with a chief complaint of SOB.  Assessment:  Started on ABX for CAP, now w/ blood cx growing coag-neg Staph in one of two bottles, likely contaminant.  Name of physician (or Provider) ContactedBrown Human MD  Current antibiotics: ceftriaxone and azithromycin  Changes to prescribed antibiotics recommended:  No changes for now.  Results for orders placed or performed during the hospital encounter of 01/02/22  Blood Culture ID Panel (Reflexed) (Collected: 01/02/2022 11:39 PM)  Result Value Ref Range   Enterococcus faecalis NOT DETECTED NOT DETECTED   Enterococcus Faecium NOT DETECTED NOT DETECTED   Listeria monocytogenes NOT DETECTED NOT DETECTED   Staphylococcus species DETECTED (A) NOT DETECTED   Staphylococcus aureus (BCID) NOT DETECTED NOT DETECTED   Staphylococcus epidermidis NOT DETECTED NOT DETECTED   Staphylococcus lugdunensis NOT DETECTED NOT DETECTED   Streptococcus species NOT DETECTED NOT DETECTED   Streptococcus agalactiae NOT DETECTED NOT DETECTED   Streptococcus pneumoniae NOT DETECTED NOT DETECTED   Streptococcus pyogenes NOT DETECTED NOT DETECTED   A.calcoaceticus-baumannii NOT DETECTED NOT DETECTED   Bacteroides fragilis NOT DETECTED NOT DETECTED   Enterobacterales NOT DETECTED NOT DETECTED   Enterobacter cloacae complex NOT DETECTED NOT DETECTED   Escherichia coli NOT DETECTED NOT DETECTED   Klebsiella aerogenes NOT DETECTED NOT DETECTED   Klebsiella oxytoca NOT DETECTED NOT DETECTED   Klebsiella pneumoniae NOT DETECTED NOT DETECTED   Proteus species NOT DETECTED NOT DETECTED   Salmonella species NOT DETECTED NOT DETECTED   Serratia marcescens NOT DETECTED NOT DETECTED   Haemophilus influenzae NOT DETECTED NOT DETECTED   Neisseria meningitidis NOT DETECTED NOT DETECTED   Pseudomonas  aeruginosa NOT DETECTED NOT DETECTED   Stenotrophomonas maltophilia NOT DETECTED NOT DETECTED   Candida albicans NOT DETECTED NOT DETECTED   Candida auris NOT DETECTED NOT DETECTED   Candida glabrata NOT DETECTED NOT DETECTED   Candida krusei NOT DETECTED NOT DETECTED   Candida parapsilosis NOT DETECTED NOT DETECTED   Candida tropicalis NOT DETECTED NOT DETECTED   Cryptococcus neoformans/gattii NOT DETECTED NOT DETECTED    Wynona Neat, PharmD, BCPS  01/04/2022  12:31 AM

## 2022-01-04 NOTE — Progress Notes (Signed)
PROGRESS NOTE    Marie Spears  BPZ:[id number] DOB: Oct 04, 1975 DOA: 01/02/2022 PCP: Doree Albee, MD (Inactive)     Brief Narrative:  Marie Spears is a 46 y.o. female with medical history significant of morbid obesity s/p bariatric surgery, DM diet controlled who presented to the hospital with chief complaint of shortness of breath and cough. Chest x-ray revealed multifocal pneumonia.  She was started on Rocephin and azithromycin.  COVID, influenza, strep pneumo antigen was negative.  7/12: Patient continues to have symptomatic shortness of breath especially with ambulation.  Not back to baseline.    Assessment & Plan:   Principal Problem:   Acute respiratory failure with hypoxia (HCC) Active Problems:   Multifocal pneumonia   Diabetes mellitus type 2 in obese (HCC)   Morbid obesity (HCC)   Right ankle joint deformity   Acute hypoxemic respiratory failure -Had SPO2 <90% on room air and required 2 L nasal cannula O2 -Now on room air and satting appropriately -BNP 55 -Patient does have pedal edema.  Check echocardiogram.  Patient does have significant family history of cardiac disease, HTN.   Multifocal pneumonia -CT chest shows groundglass opacities bilaterally.  Primary considerations are multifocal pneumonia and edema -Procalcitonin <0.10 -Rocephin, azithromycin  Gram-positive cocci in aerobic bottle -Likely to be a contaminant  Diabetes mellitus type 2 -Diet controlled as outpatient    DVT prophylaxis:  enoxaparin (LOVENOX) injection 40 mg Start: 01/03/22 0800  Code Status: Full code Family Communication: No family at bedside Disposition Plan:  Status is: Observation The patient will require care spanning > 2 midnights and should be moved to inpatient because: Breathing not quite back to baseline, echocardiogram ordered to check underlying heart disease contributing to pulmonary edema.  Remains on Rocephin and azithromycin   Antimicrobials:   Anti-infectives (From admission, onward)    Start     Dose/Rate Route Frequency Ordered Stop   01/04/22 0130  azithromycin (ZITHROMAX) 500 mg in sodium chloride 0.9 % 250 mL IVPB        500 mg 250 mL/hr over 60 Minutes Intravenous Every 24 hours 01/03/22 0145 01/09/22 0129   01/03/22 0130  levofloxacin (LEVAQUIN) IVPB 750 mg  Status:  Discontinued        750 mg 100 mL/hr over 90 Minutes Intravenous  Once 01/03/22 0119 01/03/22 0121   01/03/22 0130  cefTRIAXone (ROCEPHIN) 1 g in sodium chloride 0.9 % 100 mL IVPB        1 g 200 mL/hr over 30 Minutes Intravenous  Once 01/03/22 0121 01/03/22 0233   01/03/22 0130  azithromycin (ZITHROMAX) 500 mg in sodium chloride 0.9 % 250 mL IVPB        500 mg 250 mL/hr over 60 Minutes Intravenous  Once 01/03/22 0121 01/03/22 0413   01/03/22 0130  cefTRIAXone (ROCEPHIN) 2 g in sodium chloride 0.9 % 100 mL IVPB        2 g 200 mL/hr over 30 Minutes Intravenous Every 24 hours 01/03/22 0145 01/08/22 0129        Objective: Vitals:   01/03/22 2334 01/03/22 2345 01/04/22 0504 01/04/22 0906  BP: (!) 147/68  (!) 147/69 (!) 146/55  Pulse: 80  75 77  Resp: '18  20 18  '$ Temp: 99.6 F (37.6 C)  98.9 F (37.2 C) (!) 97.3 F (36.3 C)  TempSrc: Oral  Oral Oral  SpO2: (!) 88% 96% 97% 94%    Intake/Output Summary (Last 24 hours) at 01/04/2022 1256 Last data filed at 01/04/2022  0200 Gross per 24 hour  Intake 602.08 ml  Output --  Net 602.08 ml   There were no vitals filed for this visit.  Examination:  General exam: Appears calm and comfortable  Respiratory system: Clear to auscultation. Respiratory effort normal. No respiratory distress. No conversational dyspnea.  On room air Cardiovascular system: S1 & S2 heard, RRR. No murmurs.  Bilateral pitting ankle edema. Gastrointestinal system: Abdomen is nondistended, soft and nontender. Normal bowel sounds heard. Central nervous system: Alert and oriented. No focal neurological deficits. Speech clear.   Extremities: Symmetric in appearance  Skin: No rashes, lesions or ulcers on exposed skin  Psychiatry: Judgement and insight appear normal. Mood & affect appropriate.   Data Reviewed: I have personally reviewed following labs and imaging studies  CBC: Recent Labs  Lab 01/02/22 2318 01/03/22 0239  WBC 6.5 5.8  NEUTROABS 4.7  --   HGB 10.7* 9.9*  HCT 33.3* 31.2*  MCV 83.3 83.9  PLT 195 161   Basic Metabolic Panel: Recent Labs  Lab 01/02/22 2318 01/03/22 0239  NA 140 141  K 3.4* 3.9  CL 110 107  CO2 23 25  GLUCOSE 109* 160*  BUN 10 10  CREATININE 0.84 0.85  CALCIUM 7.8* 8.7*   GFR: CrCl cannot be calculated (Unknown ideal weight.). Liver Function Tests: Recent Labs  Lab 01/02/22 2318  AST 24  ALT 24  ALKPHOS 49  BILITOT 0.4  PROT 5.6*  ALBUMIN 3.0*   Recent Labs  Lab 01/02/22 2318  LIPASE 25   No results for input(s): "AMMONIA" in the last 168 hours. Coagulation Profile: No results for input(s): "INR", "PROTIME" in the last 168 hours. Cardiac Enzymes: No results for input(s): "CKTOTAL", "CKMB", "CKMBINDEX", "TROPONINI" in the last 168 hours. BNP (last 3 results) No results for input(s): "PROBNP" in the last 8760 hours. HbA1C: No results for input(s): "HGBA1C" in the last 72 hours. CBG: Recent Labs  Lab 01/03/22 0743 01/03/22 1146  GLUCAP 122* 154*   Lipid Profile: No results for input(s): "CHOL", "HDL", "LDLCALC", "TRIG", "CHOLHDL", "LDLDIRECT" in the last 72 hours. Thyroid Function Tests: No results for input(s): "TSH", "T4TOTAL", "FREET4", "T3FREE", "THYROIDAB" in the last 72 hours. Anemia Panel: No results for input(s): "VITAMINB12", "FOLATE", "FERRITIN", "TIBC", "IRON", "RETICCTPCT" in the last 72 hours. Sepsis Labs: Recent Labs  Lab 01/03/22 0239  PROCALCITON <0.10    Recent Results (from the past 240 hour(s))  Resp Panel by RT-PCR (Flu A&B, Covid) Anterior Nasal Swab     Status: None   Collection Time: 01/02/22 11:18 PM   Specimen:  Anterior Nasal Swab  Result Value Ref Range Status   SARS Coronavirus 2 by RT PCR NEGATIVE NEGATIVE Final    Comment: (NOTE) SARS-CoV-2 target nucleic acids are NOT DETECTED.  The SARS-CoV-2 RNA is generally detectable in upper respiratory specimens during the acute phase of infection. The lowest concentration of SARS-CoV-2 viral copies this assay can detect is 138 copies/mL. A negative result does not preclude SARS-Cov-2 infection and should not be used as the sole basis for treatment or other patient management decisions. A negative result may occur with  improper specimen collection/handling, submission of specimen other than nasopharyngeal swab, presence of viral mutation(s) within the areas targeted by this assay, and inadequate number of viral copies(<138 copies/mL). A negative result must be combined with clinical observations, patient history, and epidemiological information. The expected result is Negative.  Fact Sheet for Patients:  EntrepreneurPulse.com.au  Fact Sheet for Healthcare Providers:  IncredibleEmployment.be  This test  is no t yet approved or cleared by the [place] and  has been authorized for detection and/or diagnosis of SARS-CoV-2 by FDA under an Emergency Use Authorization (EUA). This EUA will remain  in effect (meaning this test can be used) for the duration of the COVID-19 declaration under Section 564(b)(1) of the Act, 21 U.S.C.section 360bbb-3(b)(1), unless the authorization is terminated  or revoked sooner.       Influenza A by PCR NEGATIVE NEGATIVE Final   Influenza B by PCR NEGATIVE NEGATIVE Final    Comment: (NOTE) The Xpert Xpress SARS-CoV-2/FLU/RSV plus assay is intended as an aid in the diagnosis of influenza from Nasopharyngeal swab specimens and should not be used as a sole basis for treatment. Nasal washings and aspirates are unacceptable for Xpert Xpress SARS-CoV-2/FLU/RSV testing.  Fact  Sheet for Patients: EntrepreneurPulse.com.au  Fact Sheet for Healthcare Providers: IncredibleEmployment.be  This test is not yet approved or cleared by the [place] FDA and has been authorized for detection and/or diagnosis of SARS-CoV-2 by FDA under an Emergency Use Authorization (EUA). This EUA will remain in effect (meaning this test can be used) for the duration of the COVID-19 declaration under Section 564(b)(1) of the Act, 21 U.S.C. section 360bbb-3(b)(1), unless the authorization is terminated or revoked.  Performed at [facility], [place] [address], [place], [place] [address]   Culture, blood (routine x 2)     Status: None (Preliminary result)   Collection Time: 01/02/22 11:39 PM   Specimen: BLOOD  Result Value Ref Range Status   Specimen Description BLOOD RIGHT ANTECUBITAL  Final   Special Requests   Final    BOTTLES DRAWN AEROBIC AND ANAEROBIC Blood Culture adequate volume   Culture  Setup Time   Final    GRAM POSITIVE COCCI IN CLUSTERS AEROBIC BOTTLE ONLY CRITICAL RESULT CALLED TO, READ BACK BY AND VERIFIED WITH: V BRYK,PHARMD'@0028'$  01/04/22 [place] Performed at [facility], [place] [address], [place], [place] [address]    Culture GRAM POSITIVE COCCI IN CLUSTERS  Final   Report Status PENDING  Incomplete  Blood Culture ID Panel (Reflexed)     Status: Abnormal   Collection Time: 01/02/22 11:39 PM  Result Value Ref Range Status   Enterococcus faecalis NOT DETECTED NOT DETECTED Final   Enterococcus Faecium NOT DETECTED NOT DETECTED Final   Listeria monocytogenes NOT DETECTED NOT DETECTED Final   Staphylococcus species DETECTED (A) NOT DETECTED Final    Comment: CRITICAL RESULT CALLED TO, READ BACK BY AND VERIFIED WITH: V BRYK,PHARMD'@0028'$  01/04/22 [place]    Staphylococcus aureus (BCID) NOT DETECTED NOT DETECTED Final   Staphylococcus epidermidis NOT DETECTED NOT DETECTED Final   Staphylococcus lugdunensis NOT DETECTED NOT  DETECTED Final   Streptococcus species NOT DETECTED NOT DETECTED Final   Streptococcus agalactiae NOT DETECTED NOT DETECTED Final   Streptococcus pneumoniae NOT DETECTED NOT DETECTED Final   Streptococcus pyogenes NOT DETECTED NOT DETECTED Final   A.calcoaceticus-baumannii NOT DETECTED NOT DETECTED Final   Bacteroides fragilis NOT DETECTED NOT DETECTED Final   Enterobacterales NOT DETECTED NOT DETECTED Final   Enterobacter cloacae complex NOT DETECTED NOT DETECTED Final   Escherichia coli NOT DETECTED NOT DETECTED Final   Klebsiella aerogenes NOT DETECTED NOT DETECTED Final   Klebsiella oxytoca NOT DETECTED NOT DETECTED Final   Klebsiella pneumoniae NOT DETECTED NOT DETECTED Final   Proteus species NOT DETECTED NOT DETECTED Final   Salmonella species NOT DETECTED NOT DETECTED Final   Serratia marcescens NOT DETECTED NOT  DETECTED Final   Haemophilus influenzae NOT DETECTED NOT DETECTED Final   Neisseria meningitidis NOT DETECTED NOT DETECTED Final   Pseudomonas aeruginosa NOT DETECTED NOT DETECTED Final   Stenotrophomonas maltophilia NOT DETECTED NOT DETECTED Final   Candida albicans NOT DETECTED NOT DETECTED Final   Candida auris NOT DETECTED NOT DETECTED Final   Candida glabrata NOT DETECTED NOT DETECTED Final   Candida krusei NOT DETECTED NOT DETECTED Final   Candida parapsilosis NOT DETECTED NOT DETECTED Final   Candida tropicalis NOT DETECTED NOT DETECTED Final   Cryptococcus neoformans/gattii NOT DETECTED NOT DETECTED Final    Comment: Performed at [facility], [place] [address], [place], [place] [address]      Radiology Studies: CT CHEST WO CONTRAST  Result Date: 01/03/2022 CLINICAL DATA:  Abnormal xray - lung opacity/opacities; quite sudden onset of shortness of breath and multifocal opacities, no systemic evidence of infection EXAM: CT CHEST WITHOUT CONTRAST TECHNIQUE: Multidetector CT imaging of the chest was performed following the standard protocol without IV  contrast. RADIATION DOSE REDUCTION: This exam was performed according to the departmental dose-optimization program which includes automated exposure control, adjustment of the mA and/or kV according to patient size and/or use of iterative reconstruction technique. COMPARISON:  None Available. FINDINGS: Cardiovascular: No significant vascular findings. Normal heart size. No pericardial effusion. Mediastinum/Nodes: No enlarged nodes. Thyroid is unremarkable. Esophagus is unremarkable. Lungs/Pleura: Bilateral consolidative and ground-glass opacities with relative sparing of the lung periphery and lung bases. No pleural effusion or pneumothorax. Upper Abdomen: Postoperative changes sleeve gastrectomy. No acute abnormality. Musculoskeletal: No acute osseous abnormality. IMPRESSION: Bilateral consolidative and ground-glass opacities. Primary considerations are multifocal pneumonia (favored by imaging appearance) and edema. Electronically Signed   By: Macy Mis M.D.   On: 01/03/2022 14:47   DG Ankle 2 Views Right  Result Date: 01/03/2022 CLINICAL DATA:  Charcot's joint EXAM: RIGHT ANKLE - 2 VIEW COMPARISON:  None Available. FINDINGS: No fracture or dislocation is seen. The ankle mortise is intact. The base of the fifth metatarsal is unremarkable. The visualized soft tissues are unremarkable. IMPRESSION: Negative. Electronically Signed   By: Julian Hy M.D.   On: 01/03/2022 03:23   DG Foot 2 Views Right  Result Date: 01/03/2022 CLINICAL DATA:  Charcot's joint EXAM: RIGHT FOOT - 2 VIEW COMPARISON:  None Available. FINDINGS: No fracture or dislocation is seen. Mild to moderate degenerative changes of the dorsal midfoot. Small plantar and posterior calcaneal enthesophytes. Visualized soft tissues are within normal limits. IMPRESSION: No fracture or dislocation is seen. Mild to moderate degenerative changes, as above. Electronically Signed   By: Julian Hy M.D.   On: 01/03/2022 03:23   DG Chest 2  View  Result Date: 01/02/2022 CLINICAL DATA:  New onset shortness of breath. EXAM: CHEST - 2 VIEW COMPARISON:  Chest x-ray 06/29/2019 FINDINGS: There is diffuse bilateral multifocal airspace disease, right greater than left. Cardiomediastinal silhouette within normal limits. No pleural effusion or pneumothorax. No acute fractures. IMPRESSION: Diffuse bilateral multifocal airspace disease worrisome for infection. Follow-up chest x-ray recommended in 4-6 weeks to confirm resolution. Electronically Signed   By: Ronney Asters M.D.   On: 01/02/2022 23:10      Scheduled Meds:  enoxaparin (LOVENOX) injection  40 mg Subcutaneous Q24H   Continuous Infusions:  azithromycin 500 mg (01/04/22 0149)   cefTRIAXone (ROCEPHIN)  IV 2 g (01/04/22 0041)     LOS: 0 days     Dessa Phi, DO [facility] 01/04/2022, 12:56 PM   Available  via Epic secure chat 7am-7pm After these hours, please refer to coverage provider listed on [email or web address]

## 2022-01-05 ENCOUNTER — Observation Stay (HOSPITAL_BASED_OUTPATIENT_CLINIC_OR_DEPARTMENT_OTHER): Payer: BC Managed Care – PPO

## 2022-01-05 DIAGNOSIS — I5030 Unspecified diastolic (congestive) heart failure: Secondary | ICD-10-CM | POA: Diagnosis not present

## 2022-01-05 DIAGNOSIS — J9601 Acute respiratory failure with hypoxia: Secondary | ICD-10-CM | POA: Diagnosis not present

## 2022-01-05 LAB — ECHOCARDIOGRAM COMPLETE
AR max vel: 2.49 cm2
AV Area VTI: 2.61 cm2
AV Area mean vel: 2.6 cm2
AV Mean grad: 8 mmHg
AV Peak grad: 15.5 mmHg
Ao pk vel: 1.97 m/s
Area-P 1/2: 4.1 cm2
Calc EF: 55.1 %
S' Lateral: 2.5 cm
Single Plane A2C EF: 53.2 %
Single Plane A4C EF: 61 %

## 2022-01-05 LAB — GLUCOSE, CAPILLARY
Glucose-Capillary: 167 mg/dL — ABNORMAL HIGH (ref 70–99)
Glucose-Capillary: 174 mg/dL — ABNORMAL HIGH (ref 70–99)

## 2022-01-05 MED ORDER — POTASSIUM CHLORIDE ER 10 MEQ PO TBCR: 10.0000 meq | EXTENDED_RELEASE_TABLET | Freq: Every day | ORAL | 2 refills | Status: AC

## 2022-01-05 MED ORDER — AZITHROMYCIN 500 MG PO TABS: 500.0000 mg | ORAL_TABLET | Freq: Every day | ORAL | 0 refills | Status: AC

## 2022-01-05 MED ORDER — FUROSEMIDE 40 MG PO TABS: 40.0000 mg | ORAL_TABLET | Freq: Every day | ORAL | 2 refills | Status: AC

## 2022-01-05 MED ORDER — HYDRALAZINE HCL 20 MG/ML IJ SOLN: 5.0000 mg | INTRAMUSCULAR | Status: DC | PRN

## 2022-01-05 MED ORDER — CEPHALEXIN 500 MG PO CAPS
500.0000 mg | ORAL_CAPSULE | Freq: Four times a day (QID) | ORAL | 0 refills | Status: AC
Start: 2022-01-05 — End: 2022-01-07

## 2022-01-05 MED ORDER — FUROSEMIDE 40 MG PO TABS
40.0000 mg | ORAL_TABLET | Freq: Every day | ORAL | Status: DC
  Administered 2022-01-05: 40 mg via ORAL
  Filled 2022-01-05: qty 1

## 2022-01-05 NOTE — Progress Notes (Signed)
No acute distress or pain noted, lasix given before discharge, discharge instruction provided with AVS, excuse note provided.

## 2022-01-05 NOTE — Progress Notes (Signed)
  Echocardiogram 2D Echocardiogram has been performed.  Marie Spears 01/05/2022, 9:18 AM

## 2022-01-05 NOTE — Discharge Summary (Signed)
Physician Discharge Summary  Marie Spears YQM:[id number] DOB: January 09, 1976 DOA: 01/02/2022  PCP: Karlton Lemon, PA-C  Admit date: 01/02/2022 Discharge date: 01/05/2022  Admitted From: Home Disposition:  Home  Recommendations for Outpatient Follow-up:  Follow up with PCP in 1 week  Discharge Condition: Stable CODE STATUS: Full  Diet recommendation: Heart healthy, low sodium diet   Brief/Interim Summary: Marie Spears is a 46 y.o. female with medical history significant of morbid obesity s/p bariatric surgery, DM diet controlled who presented to the hospital with chief complaint of shortness of breath and cough. Chest x-ray revealed multifocal pneumonia.  She was started on Rocephin and azithromycin.  COVID, influenza, strep pneumo antigen was negative.  Due to continued exertional shortness of breath and pedal edema, echocardiogram was completed which revealed grade 2 diastolic dysfunction.  Her primary care physician had recently started patient on Lasix last week, but patient had not started just yet.  Discussed heart healthy diet, low-sodium diet, weight loss, reducing stress, importance of sleep.  Discharge Diagnoses:   Principal Problem:   Acute respiratory failure with hypoxia (HCC) Active Problems:   Multifocal pneumonia   Diabetes mellitus type 2 in obese (HCC)   Morbid obesity (HCC)   Right ankle joint deformity   Acute hypoxemic respiratory failure -Had SPO2 <90% on room air and required 2 L nasal cannula O2 -Now on room air and satting appropriately -BNP 53  Multifocal pneumonia -CT chest shows groundglass opacities bilaterally.  Primary considerations are multifocal pneumonia and edema -Procalcitonin <0.10 -Rocephin, azithromycin --> Keflex and azithromycin  Acute diastolic dysfunction -Echocardiogram revealed 60 to 65%, normal LV function, no regional wall motion abnormalities.  Grade 2 diastolic dysfunction -Lasix with potassium -Low-sodium  diet   Staph hominis in blood culture -Likely to be a contaminant  Diabetes mellitus type 2 -Diet controlled as outpatient  Hypertension -Patient states that as an outpatient, she had never had high blood pressures before.  She has never been diagnosed with hypertension.  She is started on Lasix.  Continue to monitor and consider starting on antihypertensive if remains elevated as an outpatient setting  Discharge Instructions  Discharge Instructions     ([place]) Call MD:  Anytime you have any of the following symptoms: 1) 3 pound weight gain in 24 hours or 5 pounds in 1 week 2) shortness of breath, with or without a dry hacking cough 3) swelling in the hands, feet or stomach 4) if you have to sleep on extra pillows at night in order to breathe.   Complete by: As directed    Call MD for:  difficulty breathing, headache or visual disturbances   Complete by: As directed    Call MD for:  extreme fatigue   Complete by: As directed    Call MD for:  hives   Complete by: As directed    Call MD for:  persistant dizziness or light-headedness   Complete by: As directed    Call MD for:  persistant nausea and vomiting   Complete by: As directed    Call MD for:  severe uncontrolled pain   Complete by: As directed    Call MD for:  temperature >100.4   Complete by: As directed    Diet - low sodium heart healthy   Complete by: As directed    Discharge instructions   Complete by: As directed    You were cared for by a hospitalist during your hospital stay. If you have any questions about  your discharge medications or the care you received while you were in the hospital after you are discharged, you can call the unit and ask to speak with the hospitalist on call if the hospitalist that took care of you is not available. Once you are discharged, your primary care physician will handle any further medical issues. Please note that NO REFILLS for any discharge medications will be  authorized once you are discharged, as it is imperative that you return to your primary care physician (or establish a relationship with a primary care physician if you do not have one) for your aftercare needs so that they can reassess your need for medications and monitor your lab values.   Increase activity slowly   Complete by: As directed       Allergies as of 01/05/2022       Reactions   Penicillins Itching, Rash   Itching Has patient had a PCN reaction causing immediate rash, facial/tongue/throat swelling, SOB or lightheadedness with hypotension:YES Has patient had a PCN reaction causing severe rash involving mucus membranes or skin necrosis: NO Has patient had a PCN reaction that required hospitalization NO Has patient had a PCN reaction occurring within the last 10 years: NO If all of the above answers are "NO", then may proceed with Cephalosporin use.        Medication List     STOP taking these medications    NP Thyroid 60 MG tablet Generic drug: thyroid   Rybelsus 3 MG Tabs Generic drug: Semaglutide       TAKE these medications    albuterol 108 (90 Base) MCG/ACT inhaler Commonly known as: VENTOLIN HFA Inhale 2 puffs into the lungs every 4 (four) hours as needed for wheezing or shortness of breath.   azithromycin 500 MG tablet Commonly known as: Zithromax Take 1 tablet (500 mg total) by mouth daily for 2 days.   buPROPion 300 MG 24 hr tablet Commonly known as: WELLBUTRIN XL Take 300 mg by mouth every morning.   cephALEXin 500 MG capsule Commonly known as: KEFLEX Take 1 capsule (500 mg total) by mouth 4 (four) times daily for 2 days.   ferrous sulfate 325 (65 FE) MG tablet Take 325 mg by mouth every 3 (three) days.   furosemide 40 MG tablet Commonly known as: LASIX Take 1 tablet (40 mg total) by mouth daily.   GOODY HEADACHE PO Take 1 packet by mouth 2 (two) times daily as needed (pain).   multivitamin with minerals Tabs tablet Take 1 tablet by  mouth daily.   potassium chloride 10 MEQ tablet Commonly known as: Klor-Con 10 Take 1 tablet (10 mEq total) by mouth daily. With lasix.        Follow-up Information     Karlton Lemon, PA-C. Schedule an appointment as soon as possible for a visit in 1 week(s).   Specialty: Physician Assistant Why: Check BMP after starting lasix to check kidney function and potassium level. May need to start on blood pressure medications if blood pressure remains elevated. Contact information: [address] [place] [address] [phone]                Allergies  Allergen Reactions   Penicillins Itching and Rash    Itching Has patient had a PCN reaction causing immediate rash, facial/tongue/throat swelling, SOB or lightheadedness with hypotension:YES Has patient had a PCN reaction causing severe rash involving mucus membranes or skin necrosis: NO Has patient had a PCN reaction that  required hospitalization NO Has patient had a PCN reaction occurring within the last 10 years: NO If all of the above answers are "NO", then may proceed with Cephalosporin use.       Procedures/Studies: ECHOCARDIOGRAM COMPLETE  Result Date: 01/05/2022    ECHOCARDIOGRAM REPORT   Patient Name:   MRS. Sugarcreek Date of Exam: 01/05/2022 Medical Rec #:  [id number]                 Height:       72.0 in Accession #:    [id number]                Weight:       299.0 lb Date of Birth:  1976-04-19                 BSA:          2.528 m Patient Age:    55 years                  BP:           170/75 mmHg Patient Gender: F                         HR:           70 bpm. Exam Location:  Inpatient Procedure: 2D Echo, 3D Echo, Cardiac Doppler and Color Doppler Indications:    I50.40* Unspecified combined systolic (congestive) and diastolic                 (congestive) heart failure  History:        Patient has no prior history of Echocardiogram examinations.                 Signs/Symptoms:Shortness of Breath,  Dyspnea and Edema; Risk                 Factors:Diabetes and Dyslipidemia.  Sonographer:    Roseanna Rainbow RDCS Referring Phys: [id number] [facility]  Sonographer Comments: Image acquisition challenging due to patient body habitus. IMPRESSIONS  1. Left ventricular ejection fraction, by estimation, is 60 to 65%. The left ventricle has normal function. The left ventricle has no regional wall motion abnormalities. Left ventricular diastolic parameters are consistent with Grade II diastolic dysfunction (pseudonormalization).  2. Right ventricular systolic function is normal. The right ventricular size is normal. Tricuspid regurgitation signal is inadequate for assessing PA pressure.  3. Left atrial size was mildly dilated.  4. The mitral valve is normal in structure. Trivial mitral valve regurgitation. No evidence of mitral stenosis.  5. Nodular calcification on right coronary cusp. The aortic valve is tricuspid. Aortic valve regurgitation is not visualized. No aortic stenosis is present.  6. The inferior vena cava is dilated in size with <50% respiratory variability, suggesting right atrial pressure of 15 mmHg. FINDINGS  Left Ventricle: Left ventricular ejection fraction, by estimation, is 60 to 65%. The left ventricle has normal function. The left ventricle has no regional wall motion abnormalities. The left ventricular internal cavity size was normal in size. There is  no left ventricular hypertrophy. Left ventricular diastolic parameters are consistent with Grade II diastolic dysfunction (pseudonormalization). Right Ventricle: The right ventricular size is normal. No increase in right ventricular wall thickness. Right ventricular systolic function is normal. Tricuspid regurgitation signal is inadequate for assessing PA pressure. Left Atrium: Left atrial size was mildly dilated. Right Atrium: Right atrial size was normal in size. Pericardium:  There is no evidence of pericardial effusion. Mitral Valve: The mitral valve is  normal in structure. Trivial mitral valve regurgitation. No evidence of mitral valve stenosis. Tricuspid Valve: The tricuspid valve is normal in structure. Tricuspid valve regurgitation is not demonstrated. Aortic Valve: Nodular calcification on right coronary cusp. The aortic valve is tricuspid. Aortic valve regurgitation is not visualized. No aortic stenosis is present. Aortic valve mean gradient measures 8.0 mmHg. Aortic valve peak gradient measures 15.5  mmHg. Aortic valve area, by VTI measures 2.61 cm. Pulmonic Valve: The pulmonic valve was normal in structure. Pulmonic valve regurgitation is not visualized. Aorta: The aortic root is normal in size and structure. Venous: The inferior vena cava is dilated in size with less than 50% respiratory variability, suggesting right atrial pressure of 15 mmHg. IAS/Shunts: No atrial level shunt detected by color flow Doppler.  LEFT VENTRICLE PLAX 2D LVIDd:         3.70 cm      Diastology LVIDs:         2.50 cm      LV e' medial:    8.59 cm/s LV PW:         1.20 cm      LV E/e' medial:  10.5 LV IVS:        1.00 cm      LV e' lateral:   10.00 cm/s LVOT diam:     2.30 cm      LV E/e' lateral: 9.0 LV SV:         110 LV SV Index:   44 LVOT Area:     4.15 cm  LV Volumes (MOD) LV vol d, MOD A2C: 142.0 ml LV vol d, MOD A4C: 111.0 ml LV vol s, MOD A2C: 66.5 ml LV vol s, MOD A4C: 43.3 ml LV SV MOD A2C:     75.5 ml LV SV MOD A4C:     111.0 ml LV SV MOD BP:      69.5 ml RIGHT VENTRICLE             IVC RV S prime:     11.90 cm/s  IVC diam: 2.80 cm TAPSE (M-mode): 2.2 cm LEFT ATRIUM             Index        RIGHT ATRIUM           Index LA diam:        4.10 cm 1.62 cm/m   RA Area:     10.80 cm LA Vol (A2C):   75.7 ml 29.95 ml/m  RA Volume:   24.00 ml  9.49 ml/m LA Vol (A4C):   75.3 ml 29.79 ml/m LA Biplane Vol: 75.8 ml 29.99 ml/m  AORTIC VALVE AV Area (Vmax):    2.49 cm AV Area (Vmean):   2.60 cm AV Area (VTI):     2.61 cm AV Vmax:           197.00 cm/s AV Vmean:           130.000 cm/s AV VTI:            0.422 m AV Peak Grad:      15.5 mmHg AV Mean Grad:      8.0 mmHg LVOT Vmax:         118.00 cm/s LVOT Vmean:        81.500 cm/s LVOT VTI:          0.265 m LVOT/AV VTI ratio: 0.63  AORTA  Ao Root diam: 3.20 cm Ao Asc diam:  3.00 cm MITRAL VALVE MV Area (PHT): 4.10 cm    SHUNTS MV Decel Time: 185 msec    Systemic VTI:  0.26 m MV E velocity: 90.15 cm/s  Systemic Diam: 2.30 cm MV A velocity: 88.85 cm/s MV E/A ratio:  1.01 Dalton McleanMD Electronically signed by Franki Monte Signature Date/Time: 01/05/2022/10:00:12 AM    Final    CT CHEST WO CONTRAST  Result Date: 01/03/2022 CLINICAL DATA:  Abnormal xray - lung opacity/opacities; quite sudden onset of shortness of breath and multifocal opacities, no systemic evidence of infection EXAM: CT CHEST WITHOUT CONTRAST TECHNIQUE: Multidetector CT imaging of the chest was performed following the standard protocol without IV contrast. RADIATION DOSE REDUCTION: This exam was performed according to the departmental dose-optimization program which includes automated exposure control, adjustment of the mA and/or kV according to patient size and/or use of iterative reconstruction technique. COMPARISON:  None Available. FINDINGS: Cardiovascular: No significant vascular findings. Normal heart size. No pericardial effusion. Mediastinum/Nodes: No enlarged nodes. Thyroid is unremarkable. Esophagus is unremarkable. Lungs/Pleura: Bilateral consolidative and ground-glass opacities with relative sparing of the lung periphery and lung bases. No pleural effusion or pneumothorax. Upper Abdomen: Postoperative changes sleeve gastrectomy. No acute abnormality. Musculoskeletal: No acute osseous abnormality. IMPRESSION: Bilateral consolidative and ground-glass opacities. Primary considerations are multifocal pneumonia (favored by imaging appearance) and edema. Electronically Signed   By: Macy Mis M.D.   On: 01/03/2022 14:47   DG Ankle 2 Views  Right  Result Date: 01/03/2022 CLINICAL DATA:  Charcot's joint EXAM: RIGHT ANKLE - 2 VIEW COMPARISON:  None Available. FINDINGS: No fracture or dislocation is seen. The ankle mortise is intact. The base of the fifth metatarsal is unremarkable. The visualized soft tissues are unremarkable. IMPRESSION: Negative. Electronically Signed   By: Julian Hy M.D.   On: 01/03/2022 03:23   DG Foot 2 Views Right  Result Date: 01/03/2022 CLINICAL DATA:  Charcot's joint EXAM: RIGHT FOOT - 2 VIEW COMPARISON:  None Available. FINDINGS: No fracture or dislocation is seen. Mild to moderate degenerative changes of the dorsal midfoot. Small plantar and posterior calcaneal enthesophytes. Visualized soft tissues are within normal limits. IMPRESSION: No fracture or dislocation is seen. Mild to moderate degenerative changes, as above. Electronically Signed   By: Julian Hy M.D.   On: 01/03/2022 03:23   DG Chest 2 View  Result Date: 01/02/2022 CLINICAL DATA:  New onset shortness of breath. EXAM: CHEST - 2 VIEW COMPARISON:  Chest x-ray 06/29/2019 FINDINGS: There is diffuse bilateral multifocal airspace disease, right greater than left. Cardiomediastinal silhouette within normal limits. No pleural effusion or pneumothorax. No acute fractures. IMPRESSION: Diffuse bilateral multifocal airspace disease worrisome for infection. Follow-up chest x-ray recommended in 4-6 weeks to confirm resolution. Electronically Signed   By: Ronney Asters M.D.   On: 01/02/2022 23:10       Discharge Exam: Vitals:   01/05/22 0621 01/05/22 0921  BP: (!) 170/75 (!) 168/89  Pulse: 72 70  Resp: 18 18  Temp: 98.9 F (37.2 C) 99.1 F (37.3 C)  SpO2: 95% 97%    General: Pt is alert, awake, not in acute distress Cardiovascular: RRR, S1/S2 +, mild nonpitting pedal edema Respiratory: CTA bilaterally, no wheezing, no rhonchi, no respiratory distress, no conversational dyspnea, on room air Abdominal: Soft, NT, ND, bowel sounds  + Extremities: symmetric, no cyanosis Psych: Normal mood and affect, stable judgement and insight     The results of significant diagnostics from this  hospitalization (including imaging, microbiology, ancillary and laboratory) are listed below for reference.     Microbiology: Recent Results (from the past 240 hour(s))  Resp Panel by RT-PCR (Flu A&B, Covid) Anterior Nasal Swab     Status: None   Collection Time: 01/02/22 11:18 PM   Specimen: Anterior Nasal Swab  Result Value Ref Range Status   SARS Coronavirus 2 by RT PCR NEGATIVE NEGATIVE Final    Comment: (NOTE) SARS-CoV-2 target nucleic acids are NOT DETECTED.  The SARS-CoV-2 RNA is generally detectable in upper respiratory specimens during the acute phase of infection. The lowest concentration of SARS-CoV-2 viral copies this assay can detect is 138 copies/mL. A negative result does not preclude SARS-Cov-2 infection and should not be used as the sole basis for treatment or other patient management decisions. A negative result may occur with  improper specimen collection/handling, submission of specimen other than nasopharyngeal swab, presence of viral mutation(s) within the areas targeted by this assay, and inadequate number of viral copies(<138 copies/mL). A negative result must be combined with clinical observations, patient history, and epidemiological information. The expected result is Negative.  Fact Sheet for Patients:  EntrepreneurPulse.com.au  Fact Sheet for Healthcare Providers:  IncredibleEmployment.be  This test is no t yet approved or cleared by the [place] FDA and  has been authorized for detection and/or diagnosis of SARS-CoV-2 by FDA under an Emergency Use Authorization (EUA). This EUA will remain  in effect (meaning this test can be used) for the duration of the COVID-19 declaration under Section 564(b)(1) of the Act, 21 U.S.C.section 360bbb-3(b)(1), unless the  authorization is terminated  or revoked sooner.       Influenza A by PCR NEGATIVE NEGATIVE Final   Influenza B by PCR NEGATIVE NEGATIVE Final    Comment: (NOTE) The Xpert Xpress SARS-CoV-2/FLU/RSV plus assay is intended as an aid in the diagnosis of influenza from Nasopharyngeal swab specimens and should not be used as a sole basis for treatment. Nasal washings and aspirates are unacceptable for Xpert Xpress SARS-CoV-2/FLU/RSV testing.  Fact Sheet for Patients: EntrepreneurPulse.com.au  Fact Sheet for Healthcare Providers: IncredibleEmployment.be  This test is not yet approved or cleared by the [place] FDA and has been authorized for detection and/or diagnosis of SARS-CoV-2 by FDA under an Emergency Use Authorization (EUA). This EUA will remain in effect (meaning this test can be used) for the duration of the COVID-19 declaration under Section 564(b)(1) of the Act, 21 U.S.C. section 360bbb-3(b)(1), unless the authorization is terminated or revoked.  Performed at [facility], [place] [address] [place], [place] [address]   Culture, blood (routine x 2)     Status: Abnormal   Collection Time: 01/02/22 11:39 PM   Specimen: BLOOD  Result Value Ref Range Status   Specimen Description BLOOD RIGHT ANTECUBITAL  Final   Special Requests   Final    BOTTLES DRAWN AEROBIC AND ANAEROBIC Blood Culture adequate volume   Culture  Setup Time   Final    GRAM POSITIVE COCCI IN CLUSTERS AEROBIC BOTTLE ONLY CRITICAL RESULT CALLED TO, READ BACK BY AND VERIFIED WITH: V BRYK,PHARMD'@0028'$  01/04/22 [place]    Culture (A)  Final    STAPHYLOCOCCUS HOMINIS THE SIGNIFICANCE OF ISOLATING THIS ORGANISM FROM A SINGLE SET OF BLOOD CULTURES WHEN MULTIPLE SETS ARE DRAWN IS UNCERTAIN. PLEASE NOTIFY THE MICROBIOLOGY DEPARTMENT WITHIN ONE WEEK IF SPECIATION AND SENSITIVITIES ARE REQUIRED. Performed at [facility], [place] [address] [place], [place] [address]     Report  Status 01/04/2022 FINAL  Final  Blood Culture ID Panel (Reflexed)     Status: Abnormal   Collection Time: 01/02/22 11:39 PM  Result Value Ref Range Status   Enterococcus faecalis NOT DETECTED NOT DETECTED Final   Enterococcus Faecium NOT DETECTED NOT DETECTED Final   Listeria monocytogenes NOT DETECTED NOT DETECTED Final   Staphylococcus species DETECTED (A) NOT DETECTED Final    Comment: CRITICAL RESULT CALLED TO, READ BACK BY AND VERIFIED WITH: V BRYK,PHARMD'@0028'$  01/04/22 [place]    Staphylococcus aureus (BCID) NOT DETECTED NOT DETECTED Final   Staphylococcus epidermidis NOT DETECTED NOT DETECTED Final   Staphylococcus lugdunensis NOT DETECTED NOT DETECTED Final   Streptococcus species NOT DETECTED NOT DETECTED Final   Streptococcus agalactiae NOT DETECTED NOT DETECTED Final   Streptococcus pneumoniae NOT DETECTED NOT DETECTED Final   Streptococcus pyogenes NOT DETECTED NOT DETECTED Final   A.calcoaceticus-baumannii NOT DETECTED NOT DETECTED Final   Bacteroides fragilis NOT DETECTED NOT DETECTED Final   Enterobacterales NOT DETECTED NOT DETECTED Final   Enterobacter cloacae complex NOT DETECTED NOT DETECTED Final   Escherichia coli NOT DETECTED NOT DETECTED Final   Klebsiella aerogenes NOT DETECTED NOT DETECTED Final   Klebsiella oxytoca NOT DETECTED NOT DETECTED Final   Klebsiella pneumoniae NOT DETECTED NOT DETECTED Final   Proteus species NOT DETECTED NOT DETECTED Final   Salmonella species NOT DETECTED NOT DETECTED Final   Serratia marcescens NOT DETECTED NOT DETECTED Final   Haemophilus influenzae NOT DETECTED NOT DETECTED Final   Neisseria meningitidis NOT DETECTED NOT DETECTED Final   Pseudomonas aeruginosa NOT DETECTED NOT DETECTED Final   Stenotrophomonas maltophilia NOT DETECTED NOT DETECTED Final   Candida albicans NOT DETECTED NOT DETECTED Final   Candida auris NOT DETECTED NOT DETECTED Final   Candida glabrata NOT DETECTED NOT DETECTED Final   Candida krusei NOT  DETECTED NOT DETECTED Final   Candida parapsilosis NOT DETECTED NOT DETECTED Final   Candida tropicalis NOT DETECTED NOT DETECTED Final   Cryptococcus neoformans/gattii NOT DETECTED NOT DETECTED Final    Comment: Performed at [facility], [address], [place], [place] [address]     Labs: BNP (last 3 results) Recent Labs    01/02/22 2318  BNP 67.8   Basic Metabolic Panel: Recent Labs  Lab 01/02/22 2318 01/03/22 0239  NA 140 141  K 3.4* 3.9  CL 110 107  CO2 23 25  GLUCOSE 109* 160*  BUN 10 10  CREATININE 0.84 0.85  CALCIUM 7.8* 8.7*   Liver Function Tests: Recent Labs  Lab 01/02/22 2318  AST 24  ALT 24  ALKPHOS 49  BILITOT 0.4  PROT 5.6*  ALBUMIN 3.0*   Recent Labs  Lab 01/02/22 2318  LIPASE 25   No results for input(s): "AMMONIA" in the last 168 hours. CBC: Recent Labs  Lab 01/02/22 2318 01/03/22 0239  WBC 6.5 5.8  NEUTROABS 4.7  --   HGB 10.7* 9.9*  HCT 33.3* 31.2*  MCV 83.3 83.9  PLT 195 194   Cardiac Enzymes: No results for input(s): "CKTOTAL", "CKMB", "CKMBINDEX", "TROPONINI" in the last 168 hours. BNP: Invalid input(s): "POCBNP" CBG: Recent Labs  Lab 01/03/22 0743 01/03/22 1146 01/04/22 2253 01/05/22 0725 01/05/22 1130  GLUCAP 122* 154* 169* 167* 174*   D-Dimer No results for input(s): "DDIMER" in the last 72 hours. Hgb A1c No results for input(s): "HGBA1C" in the last 72 hours. Lipid Profile No results for input(s): "CHOL", "HDL", "LDLCALC", "TRIG", "CHOLHDL", "LDLDIRECT" in the last 72 hours.  Thyroid function studies No results for input(s): "TSH", "T4TOTAL", "T3FREE", "THYROIDAB" in the last 72 hours.  Invalid input(s): "FREET3" Anemia work up No results for input(s): "VITAMINB12", "FOLATE", "FERRITIN", "TIBC", "IRON", "RETICCTPCT" in the last 72 hours. Urinalysis    Component Value Date/Time   COLORURINE DARK YELLOW 04/20/2020 1652   APPEARANCEUR CLOUDY (A) 04/20/2020 1652   LABSPEC 1.032 04/20/2020 1652    PHURINE 5.5 04/20/2020 [place] 04/20/2020 1652   HGBUR NEGATIVE 04/20/2020 1652   BILIRUBINUR negative 12/23/2020 1140   KETONESUR negative 12/23/2020 [place] 04/20/2020 1652   PROTEINUR negative 12/23/2020 1140   PROTEINUR NEGATIVE 04/20/2020 1652   UROBILINOGEN 0.2 12/23/2020 1140   UROBILINOGEN 0.2 12/17/2014 1655   NITRITE Negative 12/23/2020 1140   NITRITE NEGATIVE 02/24/2016 2100   LEUKOCYTESUR Negative 12/23/2020 1140   Sepsis Labs Recent Labs  Lab 01/02/22 2318 01/03/22 0239  WBC 6.5 5.8   Microbiology Recent Results (from the past 240 hour(s))  Resp Panel by RT-PCR (Flu A&B, Covid) Anterior Nasal Swab     Status: None   Collection Time: 01/02/22 11:18 PM   Specimen: Anterior Nasal Swab  Result Value Ref Range Status   SARS Coronavirus 2 by RT PCR NEGATIVE NEGATIVE Final    Comment: (NOTE) SARS-CoV-2 target nucleic acids are NOT DETECTED.  The SARS-CoV-2 RNA is generally detectable in upper respiratory specimens during the acute phase of infection. The lowest concentration of SARS-CoV-2 viral copies this assay can detect is 138 copies/mL. A negative result does not preclude SARS-Cov-2 infection and should not be used as the sole basis for treatment or other patient management decisions. A negative result may occur with  improper specimen collection/handling, submission of specimen other than nasopharyngeal swab, presence of viral mutation(s) within the areas targeted by this assay, and inadequate number of viral copies(<138 copies/mL). A negative result must be combined with clinical observations, patient history, and epidemiological information. The expected result is Negative.  Fact Sheet for Patients:  EntrepreneurPulse.com.au  Fact Sheet for Healthcare Providers:  IncredibleEmployment.be  This test is no t yet approved or cleared by the [place] FDA and  has been authorized for  detection and/or diagnosis of SARS-CoV-2 by FDA under an Emergency Use Authorization (EUA). This EUA will remain  in effect (meaning this test can be used) for the duration of the COVID-19 declaration under Section 564(b)(1) of the Act, 21 U.S.C.section 360bbb-3(b)(1), unless the authorization is terminated  or revoked sooner.       Influenza A by PCR NEGATIVE NEGATIVE Final   Influenza B by PCR NEGATIVE NEGATIVE Final    Comment: (NOTE) The Xpert Xpress SARS-CoV-2/FLU/RSV plus assay is intended as an aid in the diagnosis of influenza from Nasopharyngeal swab specimens and should not be used as a sole basis for treatment. Nasal washings and aspirates are unacceptable for Xpert Xpress SARS-CoV-2/FLU/RSV testing.  Fact Sheet for Patients: EntrepreneurPulse.com.au  Fact Sheet for Healthcare Providers: IncredibleEmployment.be  This test is not yet approved or cleared by the [place] FDA and has been authorized for detection and/or diagnosis of SARS-CoV-2 by FDA under an Emergency Use Authorization (EUA). This EUA will remain in effect (meaning this test can be used) for the duration of the COVID-19 declaration under Section 564(b)(1) of the Act, 21 U.S.C. section 360bbb-3(b)(1), unless the authorization is terminated or revoked.  Performed at [facility], [place] [address] [place], [place] [address]   Culture, blood (routine x 2)  Status: Abnormal   Collection Time: 01/02/22 11:39 PM   Specimen: BLOOD  Result Value Ref Range Status   Specimen Description BLOOD RIGHT ANTECUBITAL  Final   Special Requests   Final    BOTTLES DRAWN AEROBIC AND ANAEROBIC Blood Culture adequate volume   Culture  Setup Time   Final    GRAM POSITIVE COCCI IN CLUSTERS AEROBIC BOTTLE ONLY CRITICAL RESULT CALLED TO, READ BACK BY AND VERIFIED WITH: V BRYK,PHARMD'@0028'$  01/04/22 [place]    Culture (A)  Final    STAPHYLOCOCCUS HOMINIS THE SIGNIFICANCE OF  ISOLATING THIS ORGANISM FROM A SINGLE SET OF BLOOD CULTURES WHEN MULTIPLE SETS ARE DRAWN IS UNCERTAIN. PLEASE NOTIFY THE MICROBIOLOGY DEPARTMENT WITHIN ONE WEEK IF SPECIATION AND SENSITIVITIES ARE REQUIRED. Performed at [facility], [place] [address], [place], [place] [address]    Report Status 01/04/2022 FINAL  Final  Blood Culture ID Panel (Reflexed)     Status: Abnormal   Collection Time: 01/02/22 11:39 PM  Result Value Ref Range Status   Enterococcus faecalis NOT DETECTED NOT DETECTED Final   Enterococcus Faecium NOT DETECTED NOT DETECTED Final   Listeria monocytogenes NOT DETECTED NOT DETECTED Final   Staphylococcus species DETECTED (A) NOT DETECTED Final    Comment: CRITICAL RESULT CALLED TO, READ BACK BY AND VERIFIED WITH: V BRYK,PHARMD'@0028'$  01/04/22 [place]    Staphylococcus aureus (BCID) NOT DETECTED NOT DETECTED Final   Staphylococcus epidermidis NOT DETECTED NOT DETECTED Final   Staphylococcus lugdunensis NOT DETECTED NOT DETECTED Final   Streptococcus species NOT DETECTED NOT DETECTED Final   Streptococcus agalactiae NOT DETECTED NOT DETECTED Final   Streptococcus pneumoniae NOT DETECTED NOT DETECTED Final   Streptococcus pyogenes NOT DETECTED NOT DETECTED Final   A.calcoaceticus-baumannii NOT DETECTED NOT DETECTED Final   Bacteroides fragilis NOT DETECTED NOT DETECTED Final   Enterobacterales NOT DETECTED NOT DETECTED Final   Enterobacter cloacae complex NOT DETECTED NOT DETECTED Final   Escherichia coli NOT DETECTED NOT DETECTED Final   Klebsiella aerogenes NOT DETECTED NOT DETECTED Final   Klebsiella oxytoca NOT DETECTED NOT DETECTED Final   Klebsiella pneumoniae NOT DETECTED NOT DETECTED Final   Proteus species NOT DETECTED NOT DETECTED Final   Salmonella species NOT DETECTED NOT DETECTED Final   Serratia marcescens NOT DETECTED NOT DETECTED Final   Haemophilus influenzae NOT DETECTED NOT DETECTED Final   Neisseria meningitidis NOT DETECTED NOT DETECTED Final    Pseudomonas aeruginosa NOT DETECTED NOT DETECTED Final   Stenotrophomonas maltophilia NOT DETECTED NOT DETECTED Final   Candida albicans NOT DETECTED NOT DETECTED Final   Candida auris NOT DETECTED NOT DETECTED Final   Candida glabrata NOT DETECTED NOT DETECTED Final   Candida krusei NOT DETECTED NOT DETECTED Final   Candida parapsilosis NOT DETECTED NOT DETECTED Final   Candida tropicalis NOT DETECTED NOT DETECTED Final   Cryptococcus neoformans/gattii NOT DETECTED NOT DETECTED Final    Comment: Performed at [facility], [address] [place] [place], [place] [address]     Patient was seen and examined on the day of discharge and was found to be in stable condition. Time coordinating discharge: 25 minutes including assessment and coordination of care, as well as examination of the patient.   SIGNED:  Dessa Phi, DO [facility] 01/05/2022, 11:59 AM

## 2022-01-05 NOTE — TOC Transition Note (Signed)
Transition of Care [facility]) - CM/SW Discharge Note   Patient Details  Name: Marie Spears MRN: [id number] Date of Birth: 1975/07/28  [facility] CM/SW Contact:  Tom-Johnson, Renea Ee, RN Phone Number: 01/05/2022, 10:52 AM   Clinical Narrative:     Patient is scheduled for discharge today. Admitted for ARF with Hypoxia. Currently on room air. From home alone. Does not have any children. Both parents and five siblings are supportive with care.  Currently employed at [employer]. Independent with care and drive self prior to admission. Does not have DME's at home. PCP is Letitia Neri and uses [facility] on [address].  No TOC needs or recommendations noted. Denies any needs. [facility] to transport at discharge. No further TOC needs noted.   Final next level of care: Home/Self Care Barriers to Discharge: Barriers Resolved   Patient Goals and CMS Choice Patient states their goals for this hospitalization and ongoing recovery are:: To return home CMS Medicare.gov Compare Post Acute Care list provided to:: Patient Choice offered to / list presented to : NA  Discharge Placement                Patient to be transferred to facility by: [facility]      Discharge Plan and Services                DME Arranged: N/A DME Agency: NA       HH Arranged: NA HH Agency: NA        Social Determinants of Health (SDOH) Interventions     Readmission Risk Interventions     No data to display

## 2022-08-10 ENCOUNTER — Ambulatory Visit (HOSPITAL_COMMUNITY)
Admission: EM | Admit: 2022-08-10 | Discharge: 2022-08-10 | Disposition: A | Discharge status: Home / Self Care | Payer: BC Managed Care – PPO | Attending: Emergency Medicine | Admitting: Emergency Medicine

## 2022-08-10 ENCOUNTER — Emergency Department (HOSPITAL_COMMUNITY): Payer: BC Managed Care – PPO

## 2022-08-10 ENCOUNTER — Encounter (HOSPITAL_COMMUNITY): Payer: Self-pay | Admitting: Emergency Medicine

## 2022-08-10 ENCOUNTER — Emergency Department (HOSPITAL_COMMUNITY)
Admission: EM | Admit: 2022-08-10 | Discharge: 2022-08-11 | Disposition: A | Discharge status: Home / Self Care | Payer: BC Managed Care – PPO | Attending: Emergency Medicine | Admitting: Emergency Medicine

## 2022-08-10 ENCOUNTER — Other Ambulatory Visit: Payer: Self-pay

## 2022-08-10 DIAGNOSIS — J45909 Unspecified asthma, uncomplicated: Secondary | ICD-10-CM | POA: Diagnosis not present

## 2022-08-10 DIAGNOSIS — E119 Type 2 diabetes mellitus without complications: Secondary | ICD-10-CM | POA: Diagnosis not present

## 2022-08-10 DIAGNOSIS — R202 Paresthesia of skin: Secondary | ICD-10-CM

## 2022-08-10 DIAGNOSIS — R0602 Shortness of breath: Secondary | ICD-10-CM | POA: Insufficient documentation

## 2022-08-10 DIAGNOSIS — R0789 Other chest pain: Secondary | ICD-10-CM | POA: Diagnosis not present

## 2022-08-10 DIAGNOSIS — R519 Headache, unspecified: Secondary | ICD-10-CM

## 2022-08-10 DIAGNOSIS — M7989 Other specified soft tissue disorders: Secondary | ICD-10-CM

## 2022-08-10 LAB — CBC WITH DIFFERENTIAL/PLATELET
Abs Immature Granulocytes: 0.01 10*3/uL (ref 0.00–0.07)
Basophils Absolute: 0 10*3/uL (ref 0.0–0.1)
Basophils Relative: 1 %
Eosinophils Absolute: 0.1 10*3/uL (ref 0.0–0.5)
Eosinophils Relative: 2 %
HCT: 37.6 % (ref 36.0–46.0)
Hemoglobin: 11.6 g/dL — ABNORMAL LOW (ref 12.0–15.0)
Immature Granulocytes: 0 %
Lymphocytes Relative: 43 %
Lymphs Abs: 2.2 10*3/uL (ref 0.7–4.0)
MCH: 25.7 pg — ABNORMAL LOW (ref 26.0–34.0)
MCHC: 30.9 g/dL (ref 30.0–36.0)
MCV: 83.4 fL (ref 80.0–100.0)
Monocytes Absolute: 0.6 10*3/uL (ref 0.1–1.0)
Monocytes Relative: 12 %
Neutro Abs: 2.1 10*3/uL (ref 1.7–7.7)
Neutrophils Relative %: 42 %
Platelets: 226 10*3/uL (ref 150–400)
RBC: 4.51 MIL/uL (ref 3.87–5.11)
RDW: 16.1 % — ABNORMAL HIGH (ref 11.5–15.5)
WBC: 5.1 10*3/uL (ref 4.0–10.5)
nRBC: 0 % (ref 0.0–0.2)

## 2022-08-10 LAB — COMPREHENSIVE METABOLIC PANEL
ALT: 18 U/L (ref 0–44)
AST: 22 U/L (ref 15–41)
Albumin: 3.7 g/dL (ref 3.5–5.0)
Alkaline Phosphatase: 68 U/L (ref 38–126)
Anion gap: 9 (ref 5–15)
BUN: 13 mg/dL (ref 6–20)
CO2: 27 mmol/L (ref 22–32)
Calcium: 9.3 mg/dL (ref 8.9–10.3)
Chloride: 101 mmol/L (ref 98–111)
Creatinine, Ser: 1.2 mg/dL — ABNORMAL HIGH (ref 0.44–1.00)
GFR, Estimated: 55 mL/min — ABNORMAL LOW (ref >60)
Glucose, Bld: 104 mg/dL — ABNORMAL HIGH (ref 70–99)
Potassium: 4 mmol/L (ref 3.5–5.1)
Sodium: 137 mmol/L (ref 135–145)
Total Bilirubin: 0.4 mg/dL (ref 0.3–1.2)
Total Protein: 7.3 g/dL (ref 6.5–8.1)

## 2022-08-10 LAB — TROPONIN I (HIGH SENSITIVITY)
Troponin I (High Sensitivity): 11 ng/L (ref <18)
Troponin I (High Sensitivity): 11 ng/L (ref <18)

## 2022-08-10 LAB — BRAIN NATRIURETIC PEPTIDE: B Natriuretic Peptide: 85.7 pg/mL (ref 0.0–100.0)

## 2022-08-10 NOTE — ED Triage Notes (Signed)
Pt reports that been having issues with bilat leg swelling for a while and takes fluid pills but since Monday swelling is just getting worse.   Reports feels like has fluid in chest, headache and left arm numbness since around 430pm today.  Pt has decreased sensation on left face, arm and leg.   Saturday saw PCP and had EKG that was abnormal and wants her to get an ECHO. Saw patient swelling in legs.

## 2022-08-10 NOTE — ED Triage Notes (Signed)
Patient reports worsening bilateral legs swelling/edema onset this week with mild SOB . She takes furosemide daily.

## 2022-08-10 NOTE — Discharge Instructions (Addendum)
While you are neurologically intact here, I am concerned with your headache and facial and arm numbness that you could be having something serious.  I am also concerned that you may be in early congestive heart failure.

## 2022-08-10 NOTE — ED Provider Triage Note (Signed)
Emergency Medicine Provider Triage Evaluation Note  Marie Spears , a 47 y.o. female  was evaluated in triage.  Pt complains of leg swelling and shortness of breath. States she has felt SOB since Monday with worsening LE edema. States that she takes 52m lasix daily. Has not missed any doses. She denies chest pain but states she has had numbness intermittently in her left arm. Does not see a cardiologist.   Review of Systems  Positive: See above Negative:   Physical Exam  BP (!) 159/81 (BP Location: Right Arm)   Pulse 68   Temp 98.8 F (37.1 C) (Oral)   Resp 19   LMP 07/20/2022   SpO2 100%  Gen:   Awake, no distress   Resp:  Normal effort  MSK:   Moves extremities without difficulty  Other:  BLE edema.   Medical Decision Making  Medically screening exam initiated at 7:48 PM.  Appropriate orders placed.  Joanny AKalice Janeywas informed that the remainder of the evaluation will be completed by another provider, this initial triage assessment does not replace that evaluation, and the importance of remaining in the ED until their evaluation is complete.     AMickie Hillier PA-C 08/10/22 1954

## 2022-08-10 NOTE — ED Provider Notes (Signed)
HPI  SUBJECTIVE:  Marie Spears is a 47 y.o. female who presents with a sudden onset, intermittent left-sided headache accompanied with left facial and intermittent left arm numbness starting at 1630/1700 today.  She states it was worse at its onset.  No visual changes, slurred speech, arm or leg weakness, facial droop, discoordination.  No aggravating or alleviating factors.  She has not tried anything for this.  She is also reporting painful bilateral lower extremity edema, left-sided chest pressure described as "something sitting on my chest" that is intermittent, lasting hours, present for the past 4 days.  She reports wheezing, coughing, inability to lie flat secondary to shortness of breath, dyspnea on exertion.  She tried Lasix with improvement in her symptoms.  Her last dose was 80 mg today.  Her chest pressure is worse with stress.  Not sure if she has gained any weight over the past 4 days.  No PND.  She has a past medical history of asthma, diabetes, hypercholesterolemia, BMI above 30, superficial thrombophlebitis right leg..  No history of CHF, hypertension, CVA, aneurysm, MI, coronary artery disease, smoking, PE.  Family history significant for maternal grandfather with MI, CVA at unknown age.  No immediate family with CVA/MI.  [facility]P: [facility].    Past Medical History:  Diagnosis Date   Asthma    Bronchitis    Diabetes mellitus without complication ([place])    DVT (deep venous thrombosis) ([place])    Obesity     Past Surgical History:  Procedure Laterality Date   BARIATRIC SURGERY  06/23/2019   [facility] 347lbs prior to surgery.   BIOPSY  08/16/2020   Procedure: BIOPSY;  Surgeon: Eloise Harman, DO;  [facility];  Service: Endoscopy;;   COLONOSCOPY WITH PROPOFOL N/A 08/16/2020   Procedure: COLONOSCOPY WITH PROPOFOL;  Surgeon: Eloise Harman, DO;  [facility];  Service: Endoscopy;  Laterality: N/A;   ESOPHAGOGASTRODUODENOSCOPY (EGD) WITH PROPOFOL  N/A 08/16/2020   Procedure: ESOPHAGOGASTRODUODENOSCOPY (EGD) WITH PROPOFOL;  Surgeon: Eloise Harman, DO;  [facility];  Service: Endoscopy;  Laterality: N/A;  8:30am   HERNIA REPAIR     POLYPECTOMY  08/16/2020   Procedure: POLYPECTOMY;  Surgeon: Eloise Harman, DO;  [facility];  Service: Endoscopy;;    Family History  Problem Relation Age of Onset   Heart disease Mother    Diabetes Mother    Hypertension Father    Diabetes Sister    Diabetes Brother    Heart attack Maternal Grandfather 39    Social History   Tobacco Use   Smoking status: Never   Smokeless tobacco: Never  Vaping Use   Vaping Use: Never used  Substance Use Topics   Alcohol use: Yes    Comment: occasionally   Drug use: No    No current facility-administered medications for this encounter.  Current Outpatient Medications:    albuterol (VENTOLIN HFA) 108 (90 Base) MCG/ACT inhaler, Inhale 2 puffs into the lungs every 4 (four) hours as needed for wheezing or shortness of breath. (Patient not taking: Reported on 01/03/2022), Disp: 1 each, Rfl: 6   Aspirin-Acetaminophen-Caffeine (GOODY HEADACHE PO), Take 1 packet by mouth 2 (two) times daily as needed (pain)., Disp: , Rfl:    buPROPion (WELLBUTRIN XL) 300 MG 24 hr tablet, Take 300 mg by mouth every morning., Disp: , Rfl:    ferrous sulfate 325 (65 FE) MG tablet, Take 325 mg by mouth every 3 (three) days., Disp: ,  Rfl:    furosemide (LASIX) 40 MG tablet, Take 1 tablet (40 mg total) by mouth daily., Disp: 30 tablet, Rfl: 2   Multiple Vitamin (MULTIVITAMIN WITH MINERALS) TABS tablet, Take 1 tablet by mouth daily., Disp: , Rfl:    potassium chloride (KLOR-CON 10) 10 MEQ tablet, Take 1 tablet (10 mEq total) by mouth daily. With lasix., Disp: 30 tablet, Rfl: 2  Allergies  Allergen Reactions   Penicillins Itching and Rash    Itching Has patient had a PCN reaction causing immediate rash, facial/tongue/throat swelling, SOB or lightheadedness  with hypotension:YES Has patient had a PCN reaction causing severe rash involving mucus membranes or skin necrosis: NO Has patient had a PCN reaction that required hospitalization NO Has patient had a PCN reaction occurring within the last 10 years: NO If all of the above answers are "NO", then may proceed with Cephalosporin use.      ROS  As noted in HPI.   Physical Exam  BP 136/68 (BP Location: Right Arm)   Pulse 70   Temp 98.4 F (36.9 C) (Oral)   Resp 17   SpO2 100%   Constitutional: Well developed, well nourished, no acute distress Eyes:  EOMI, conjunctiva normal bilaterally HENT: Normocephalic, atraumatic,mucus membranes moist Respiratory: Normal inspiratory effort, lungs clear bilaterally, good air movement. Cardiovascular: Normal rate, regular rhythm, no murmurs rubs or gallops GI: nondistended skin: No rash, skin intact Musculoskeletal: no deformities Neurologic: Alert & oriented x 3, cranial nerves III through XII intact.  No facial droop.  Speech fluent.  Answering questions appropriately.  No pronator drift upper, lower extremities.  Sensation equal in the face.  Strength and sensation grossly intact and equal bilaterally in the upper, lower extremities. Psychiatric: Speech and behavior appropriate   ED Course   Medications - No data to display  Orders Placed This Encounter  Procedures   EKG 12-Lead    Standing Status:   Standing    Number of Occurrences:   1    No results found for this or any previous visit (from the past 24 hour(s)). No results found.  ED Clinical Impression  1. Paresthesia   2. Acute nonintractable headache, unspecified headache type   3. Left chest pressure      ED Assessment/Plan    EKG.  Normal sinus rhythm, rate 71.  Normal axis, normal voltage.  No hypertrophy.  No ST-T wave changes.  Patient symptomatic while EKG was obtained.  1.  Headache with left facial and arm numbness.  Patient is completely neurologically  intact here.  Her speech is fluent, she has sensation in her face, upper and lower extremities are grossly intact and equal bilaterally.  Strength upper and lower extremities equal.  She has no pronator drift.  She is able to walk without any problems.  However, given the acuity of her symptoms, transferring to the emergency department to rule out TIA/CVA.  She states that her sister drove her, feel that with the absence of any neurologic signs, I feel that she is stable to go by private vehicle.  2.  Chest pressure/heaviness, shortness of breath, lower extremity edema.  EKG is normal.  She is not hypoxic, no respiratory distress, and I was unable to appreciate any crackles in her lungs.  She does however have 1-2+ bilateral lower extremity edema.  Could be asthma, however, primary concern is for early congestive heart failure.  Discussed rationale for transfer to the emergency department with patient.  Emphasized importance of going there immediately.  She agrees to go.  No orders of the defined types were placed in this encounter.     *This clinic note was created using Dragon dictation software. Therefore, there may be occasional mistakes despite careful proofreading.  ?    Melynda Ripple, MD 08/10/22 1919

## 2022-08-10 NOTE — ED Notes (Signed)
Patient is being discharged from the Urgent Care and sent to the Emergency Department via POV. Per Dr Alphonzo Cruise, patient is in need of higher level of care due to numbness, chest heaviness, bilateral leg swelling. Patient is aware and verbalizes understanding of plan of care.  Vitals:   08/10/22 1842  BP: 136/68  Pulse: 70  Resp: 17  Temp: 98.4 F (36.9 C)  SpO2: 100%

## 2022-08-11 ENCOUNTER — Encounter (HOSPITAL_COMMUNITY): Payer: Self-pay | Admitting: Emergency Medicine

## 2022-08-11 LAB — D-DIMER, QUANTITATIVE: D-Dimer, Quant: 0.32 ug/mL-FEU (ref 0.00–0.50)

## 2022-08-11 NOTE — Discharge Instructions (Signed)
You were evaluated in the Emergency Department and after careful evaluation, we did not find any emergent condition requiring admission or further testing in the hospital.  Your exam/testing today is overall reassuring.  No signs of blood clots or fluid on your lungs today.  Recommend follow-up with your PCP and/or a cardiologist to discuss your symptoms.  Recommend elevating the legs and using compression stockings as we discussed.  Please return to the Emergency Department if you experience any worsening of your condition.   Thank you for allowing [place] to be a part of your care.

## 2022-08-11 NOTE — ED Provider Notes (Signed)
[facility] Emergency Department Provider Note MRN:  [id number]  Arrival date & time: 08/11/22     Chief Complaint   Legs Swelling   History of Present Illness   Laurette Atlee Mcadoo is a 47 y.o. year-old female with a history of diabetes presenting to the ED with chief complaint of leg swelling.  Increased swelling to bilateral lower extremities over the past few days.  Missed 1 or 2 doses of her Lasix which she takes for leg swelling.  Some shortness of breath when laying flat as well.  Denies chest pain, no fever, no cough, no other complaints.  Review of Systems  A thorough review of systems was obtained and all systems are negative except as noted in the HPI and PMH.   Patient's Health History    Past Medical History:  Diagnosis Date   Asthma    Bronchitis    Diabetes mellitus without complication ([place])    DVT (deep venous thrombosis) ([place])    Obesity     Past Surgical History:  Procedure Laterality Date   BARIATRIC SURGERY  06/23/2019   [facility] 347lbs prior to surgery.   BIOPSY  08/16/2020   Procedure: BIOPSY;  Surgeon: Eloise Harman, DO;  [facility];  Service: Endoscopy;;   COLONOSCOPY WITH PROPOFOL N/A 08/16/2020   Procedure: COLONOSCOPY WITH PROPOFOL;  Surgeon: Eloise Harman, DO;  [facility];  Service: Endoscopy;  Laterality: N/A;   ESOPHAGOGASTRODUODENOSCOPY (EGD) WITH PROPOFOL N/A 08/16/2020   Procedure: ESOPHAGOGASTRODUODENOSCOPY (EGD) WITH PROPOFOL;  Surgeon: Eloise Harman, DO;  [facility];  Service: Endoscopy;  Laterality: N/A;  8:30am   HERNIA REPAIR     POLYPECTOMY  08/16/2020   Procedure: POLYPECTOMY;  Surgeon: Eloise Harman, DO;  [facility];  Service: Endoscopy;;    Family History  Problem Relation Age of Onset   Heart disease Mother    Diabetes Mother    Hypertension Father    Diabetes Sister    Diabetes Brother    Heart attack Maternal Grandfather 74    Social  History   Socioeconomic History   Marital status: Divorced    Spouse name: Not on file   Number of children: Not on file   Years of education: Not on file   Highest education level: Not on file  Occupational History   Not on file  Tobacco Use   Smoking status: Never   Smokeless tobacco: Never  Vaping Use   Vaping Use: Never used  Substance and Sexual Activity   Alcohol use: Yes    Comment: occasionally   Drug use: No   Sexual activity: Not on file  Other Topics Concern   Not on file  Social History Narrative   Divorced since 2002,married for 4.5 years.Lives alone.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   Social Determinants of Health   Financial Resource Strain: Not on file  Food Insecurity: Not on file  Transportation Needs: Not on file  Physical Activity: Not on file  Stress: Not on file  Social Connections: Not on file  Intimate Partner Violence: Not on file     Physical Exam   Vitals:   08/10/22 2319 08/11/22 0030  BP: (!) 155/72 131/70  Pulse: 68 67  Resp: 18 12  Temp: 98.4 F (36.9 C)   SpO2: 100% 100%    CONSTITUTIONAL: Well-appearing, NAD NEURO/PSYCH:  Alert and oriented x 3, no focal deficits EYES:  eyes equal  and reactive ENT/NECK:  no LAD, no JVD CARDIO: Regular rate, well-perfused, normal S1 and S2 PULM:  CTAB no wheezing or rhonchi GI/GU:  non-distended, non-tender MSK/SPINE:  No gross deformities, scant edema to bilateral ankles SKIN:  no rash, atraumatic   *Additional and/or pertinent findings included in MDM below  Diagnostic and Interventional Summary    EKG Interpretation  Date/Time:  Thursday August 10 2022 20:13:00 EST Ventricular Rate:  70 PR Interval:  158 QRS Duration: 80 QT Interval:  394 QTC Calculation: 425 R Axis:   13 Text Interpretation: Normal sinus rhythm Normal ECG When compared with ECG of 10-Aug-2022 18:54, PREVIOUS ECG IS PRESENT Confirmed by Gerlene Fee [phone]) on 08/11/2022 12:41:34  AM       Labs Reviewed  COMPREHENSIVE METABOLIC PANEL - Abnormal; Notable for the following components:      Result Value   Glucose, Bld 104 (*)    Creatinine, Ser 1.20 (*)    GFR, Estimated 55 (*)    All other components within normal limits  CBC WITH DIFFERENTIAL/PLATELET - Abnormal; Notable for the following components:   Hemoglobin 11.6 (*)    MCH 25.7 (*)    RDW 16.1 (*)    All other components within normal limits  BRAIN NATRIURETIC PEPTIDE  D-DIMER, QUANTITATIVE  TROPONIN I (HIGH SENSITIVITY)  TROPONIN I (HIGH SENSITIVITY)    DG Chest 2 View  Final Result      Medications - No data to display   Procedures  /  Critical Care Procedures  ED Course and Medical Decision Making  Initial Impression and Ddx Favoring venous insufficiency is the cause of patient's swelling, which is chronic and has been present for several months, possibly a bit worse over the past few days.  Having some mild dyspnea on exertion, mild orthopnea as well.  And so new onset CHF is considered, there is a documented history of DVT however patient explains that she had a superficial blood clot did not have a DVT in the past.  Low concern for DVT at this time, screening with D-dimer.  Past medical/surgical history that increases complexity of ED encounter: Questionable DVT history  Interpretation of Diagnostics I personally reviewed the EKG and my interpretation is as follows: Sinus rhythm  Labs reassuring with no significant blood count or electrolyte disturbance, troponin negative x 2, D-dimer negative  Patient Reassessment and Ultimate Disposition/Management     Given the reassuring workup and patient's well-appearing status, normal vital signs, appropriate for discharge.  Patient management required discussion with the following services or consulting groups:  None  Complexity of Problems Addressed Acute illness or injury that poses threat of life of bodily function  Additional Data  Reviewed and Analyzed Further history obtained from: Further history from spouse/family member  Additional Factors Impacting ED Encounter Risk None  Barth Kirks. [place] Small, [place] [email or web address]  Final Clinical Impressions(s) / ED Diagnoses     ICD-10-CM   1. Leg swelling  M79.89       ED Discharge Orders     None        Discharge Instructions Discussed with and Provided to Patient:     Discharge Instructions      You were evaluated in the Emergency Department and after careful evaluation, we did not find any emergent condition requiring admission or further testing in the hospital.  Your exam/testing today is overall reassuring.  No signs of blood clots or fluid on your lungs today.  Recommend follow-up with your PCP and/or a cardiologist to discuss your symptoms.  Recommend elevating the legs and using compression stockings as we discussed.  Please return to the Emergency Department if you experience any worsening of your condition.   Thank you for allowing [place] to be a part of your care.       Maudie Flakes, MD 08/11/22 [phone]

## 2022-11-22 ENCOUNTER — Ambulatory Visit: Payer: BC Managed Care – PPO | Admitting: Podiatry

## 2023-08-18 ENCOUNTER — Ambulatory Visit
Admission: EM | Admit: 2023-08-18 | Discharge: 2023-08-18 | Disposition: A | Discharge status: Home / Self Care | Payer: 59 | Attending: Family Medicine | Admitting: Family Medicine

## 2023-08-18 ENCOUNTER — Other Ambulatory Visit: Payer: Self-pay

## 2023-08-18 ENCOUNTER — Encounter: Payer: Self-pay | Admitting: Emergency Medicine

## 2023-08-18 DIAGNOSIS — L309 Dermatitis, unspecified: Secondary | ICD-10-CM

## 2023-08-18 DIAGNOSIS — L03115 Cellulitis of right lower limb: Secondary | ICD-10-CM | POA: Diagnosis not present

## 2023-08-18 MED ORDER — DOXYCYCLINE HYCLATE 100 MG PO CAPS: 100.0000 mg | ORAL_CAPSULE | Freq: Two times a day (BID) | ORAL | 0 refills | Status: AC

## 2023-08-18 MED ORDER — TRIAMCINOLONE ACETONIDE 0.1 % EX CREA: 1.0000 | TOPICAL_CREAM | Freq: Two times a day (BID) | CUTANEOUS | 0 refills | Status: AC

## 2023-08-18 MED ORDER — DEXAMETHASONE SODIUM PHOSPHATE 10 MG/ML IJ SOLN
10.0000 mg | Freq: Once | INTRAMUSCULAR | Status: AC
  Administered 2023-08-18: 10 mg via INTRAMUSCULAR

## 2023-08-18 NOTE — Discharge Instructions (Signed)
 You have been given a shot of dexamethasone 10 mg, steroid.  Triamcinolone cream--apply 2 times daily to the rash area until better, about 10 to 14 days.  Take doxycycline 100 mg --1 capsule 2 times daily for 7 days

## 2023-08-18 NOTE — ED Triage Notes (Signed)
 Right foot is itching -sole of foot.  Onset 3 days ago.  Patient has taken left over amoxicillin and says she is allergic to amoxicillin and is desperate.  Has used otc cream from store.  Foot sole looks dry, no other abnormality

## 2023-08-18 NOTE — ED Provider Notes (Signed)
 [facility]    CSN: [id number] Arrival date & time: 08/18/23  1315      History   Chief Complaint No chief complaint on file.   HPI Marie Spears is a 47 y.o. female.   HPI Here for itching and stinging pain of the sole of her right foot.  It has been going on for about 3 days.  No fever or chills.  No edema.  Prior to this she had been working on getting the calluses off her feet.  She has prediabetes, not diabetes she states.  Benadryl has not helped the itching on her feet. She is allergic to penicillins which cause rash and itching.  She was desperate to get the symptoms better and so took some leftover amoxicillin today.  Past Medical History:  Diagnosis Date   Asthma    Bronchitis    Diabetes mellitus without complication (HCC)    DVT (deep venous thrombosis) (HCC)    Obesity     Patient Active Problem List   Diagnosis Date Noted   Acute respiratory failure with hypoxia (HCC) 01/03/2022   Multifocal pneumonia 01/03/2022   Right ankle joint deformity 01/03/2022   Acid reflux 07/21/2020   Dysphagia 07/21/2020   Normocytic anemia 07/21/2020   Asthma 08/03/2018   Status post right cataract extraction 11/29/2017   Age-related nuclear cataract of left eye 10/24/2017   Anterior subcapsular cataract of right eye 10/24/2017   Posterior subcapsular age-related cataract of both eyes 10/24/2017   Diabetes mellitus (HCC) 07/21/2016   Hyperlipidemia 07/21/2016   Hidradenitis suppurativa 07/20/2016   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   BIOPSY  08/16/2020   Procedure: BIOPSY;  Surgeon: Lanelle Bal, DO;  Location: [facility];  Service: Endoscopy;;   COLONOSCOPY WITH PROPOFOL N/A 08/16/2020   Procedure: COLONOSCOPY WITH PROPOFOL;  Surgeon: Lanelle Bal, DO;   Location: [facility];  Service: Endoscopy;  Laterality: N/A;   ESOPHAGOGASTRODUODENOSCOPY (EGD) WITH PROPOFOL N/A 08/16/2020   Procedure: ESOPHAGOGASTRODUODENOSCOPY (EGD) WITH PROPOFOL;  Surgeon: Lanelle Bal, DO;  Location: [facility];  Service: Endoscopy;  Laterality: N/A;  8:30am   HERNIA REPAIR     POLYPECTOMY  08/16/2020   Procedure: POLYPECTOMY;  Surgeon: Lanelle Bal, DO;  Location: [facility];  Service: Endoscopy;;    OB History   No obstetric history on file.      Home Medications    Prior to Admission medications   Medication Sig Start Date End Date Taking? Authorizing Provider  doxycycline (VIBRAMYCIN) 100 MG capsule Take 1 capsule (100 mg total) by mouth 2 (two) times daily for 7 days. 08/18/23 08/25/23 Yes Zenia Resides, MD  triamcinolone cream (KENALOG) 0.1 % Apply 1 Application topically 2 (two) times daily. To affected area till better 08/18/23  Yes Mabry Santarelli, Janace Aris, MD  albuterol (VENTOLIN HFA) 108 (90 Base) MCG/ACT inhaler Inhale 2 puffs into the lungs every 4 (four) hours as needed for wheezing or shortness of breath. 06/15/20   Wilson Singer, MD  Aspirin-Acetaminophen-Caffeine (GOODY HEADACHE PO) Take 1 packet by mouth 2 (two) times daily as needed (pain).    [provider]  buPROPion (WELLBUTRIN XL) 300 MG 24 hr tablet Take 300 mg by mouth every morning. 08/31/21   [provider]  ferrous sulfate  325 (65 FE) MG tablet Take 325 mg by mouth every 3 (three) days.    [provider]  furosemide (LASIX) 40 MG tablet Take 1 tablet (40 mg total) by mouth daily. 01/05/22   Noralee Stain, DO  Multiple Vitamin (MULTIVITAMIN WITH MINERALS) TABS tablet Take 1 tablet by mouth daily.    [provider]  potassium chloride (KLOR-CON 10) 10 MEQ tablet Take 1 tablet (10 mEq total) by mouth daily. With lasix. 01/05/22   Noralee Stain, DO    Family History Family History  Problem Relation Age of Onset   Heart disease Mother     Diabetes Mother    Hypertension Father    Diabetes Sister    Diabetes Brother    Heart attack Maternal Grandfather 1    Social History Social History   Tobacco Use   Smoking status: Never   Smokeless tobacco: Never  Vaping Use   Vaping status: Never Used  Substance Use Topics   Alcohol use: Yes    Comment: occasionally   Drug use: No     Allergies   Penicillins   Review of Systems Review of Systems   Physical Exam Triage Vital Signs ED Triage Vitals  Encounter Vitals Group     BP 08/18/23 1352 (!) 153/77     Systolic BP Percentile --      Diastolic BP Percentile --      Pulse Rate 08/18/23 1354 72     Resp 08/18/23 1352 18     Temp 08/18/23 1352 97.9 F (36.6 C)     Temp Source 08/18/23 1352 Oral     SpO2 08/18/23 1352 96 %     Weight --      Height --      Head Circumference --      Peak Flow --      Pain Score 08/18/23 1348 9     Pain Loc --      Pain Education --      Exclude from Growth Chart --    No data found.  Updated Vital Signs BP (!) 153/77 (BP Location: Left Arm)   Pulse 72   Temp 97.8 F (36.6 C) (Oral)   Resp 20   LMP 07/26/2023 (Approximate)   SpO2 97%   Visual Acuity Right Eye Distance:   Left Eye Distance:   Bilateral Distance:    Right Eye Near:   Left Eye Near:    Bilateral Near:     Physical Exam Vitals reviewed.  Constitutional:      General: She is not in acute distress.    Appearance: She is not toxic-appearing.  Skin:    Coloration: Skin is not jaundiced or pale.     Comments: On the sole of her right foot there is a spot on the medial ball of the foot that is mildly erythematous and about 1.5 cm in diameter.  There are 2 more similar spots on the sole of her foot on the posterior instep.  There is no fluctuance.  The area is not very tender.  There is no swelling  No ulceration    Neurological:     General: No focal deficit present.     Mental Status: She is alert and oriented to person, place, and  time.  Psychiatric:        Behavior: Behavior normal.      UC Treatments / Results  Labs (all labs ordered are listed, but only abnormal results are displayed) Labs  Reviewed - No data to display  EKG   Radiology No results found.  Procedures Procedures (including critical care time)  Medications Ordered in UC Medications  dexamethasone (DECADRON) injection 10 mg (has no administration in time range)    Initial Impression / Assessment and Plan / UC Course  I have reviewed the triage vital signs and the nursing notes.  Pertinent labs & imaging results that were available during my care of the patient were reviewed by me and considered in my medical decision making (see chart for details).     With her having been using abrasive treatments on her feet, there is a little concern for cellulitis.  It also could be allergic in nature.  Decadron injection is given here and steroid cream is sent to the pharmacy to apply topically.  Also doxycycline is sent in for potential cellulitis. Final Clinical Impressions(s) / UC Diagnoses   Final diagnoses:  Dermatitis of foot  Cellulitis of right lower extremity     Discharge Instructions      You have been given a shot of dexamethasone 10 mg, steroid.  Triamcinolone cream--apply 2 times daily to the rash area until better, about 10 to 14 days.  Take doxycycline 100 mg --1 capsule 2 times daily for 7 days      ED Prescriptions     Medication Sig Dispense Auth. Provider   doxycycline (VIBRAMYCIN) 100 MG capsule Take 1 capsule (100 mg total) by mouth 2 (two) times daily for 7 days. 14 capsule Zenia Resides, MD   triamcinolone cream (KENALOG) 0.1 % Apply 1 Application topically 2 (two) times daily. To affected area till better 80 g Marlinda Mike Janace Aris, MD      PDMP not reviewed this encounter.   Zenia Resides, MD 08/18/23 [phone]

## 2023-12-20 ENCOUNTER — Other Ambulatory Visit: Payer: Self-pay | Admitting: Obstetrics and Gynecology

## 2023-12-20 DIAGNOSIS — Z1231 Encounter for screening mammogram for malignant neoplasm of breast: Secondary | ICD-10-CM

## 2024-01-08 ENCOUNTER — Ambulatory Visit: Payer: Self-pay | Admitting: Podiatry

## 2024-01-08 DIAGNOSIS — M21961 Unspecified acquired deformity of right lower leg: Secondary | ICD-10-CM

## 2024-01-08 DIAGNOSIS — M21962 Unspecified acquired deformity of left lower leg: Secondary | ICD-10-CM

## 2024-01-08 DIAGNOSIS — Q666 Other congenital valgus deformities of feet: Secondary | ICD-10-CM | POA: Diagnosis not present

## 2024-01-08 NOTE — Progress Notes (Signed)
 Subjective:  Patient ID: Marie Spears, female    DOB: [birth year],  MRN: [id number]  Chief Complaint  Patient presents with   Toe Pain    Pt stated that her toes feel very stiff and tight     48 y.o. female presents with the above complaint.  Patient presents for bilateral plaque flatfoot deformity.  Patient states that she has been in with this for quite some time is causing her toes to feel stiff and tight.  She wanted get it evaluated she does not wear any orthotics she wears regular shoes.  Pain scale is manageable.  Is more 2 out of 10.  She would like to discuss orthotics options   Review of Systems: Negative except as noted in the HPI. Denies N/V/F/Ch.  Past Medical History:  Diagnosis Date   Asthma    Bronchitis    Diabetes mellitus without complication (HCC)    DVT (deep venous thrombosis) (HCC)    Obesity     Current Outpatient Medications:    albuterol  (VENTOLIN  HFA) 108 (90 Base) MCG/ACT inhaler, Inhale 2 puffs into the lungs every 4 (four) hours as needed for wheezing or shortness of breath., Disp: 1 each, Rfl: 6   Aspirin-Acetaminophen -Caffeine (GOODY HEADACHE PO), Take 1 packet by mouth 2 (two) times daily as needed (pain)., Disp: , Rfl:    buPROPion (WELLBUTRIN XL) 300 MG 24 hr tablet, Take 300 mg by mouth every morning., Disp: , Rfl:    ferrous sulfate 325 (65 FE) MG tablet, Take 325 mg by mouth every 3 (three) days., Disp: , Rfl:    furosemide  (LASIX ) 40 MG tablet, Take 1 tablet (40 mg total) by mouth daily., Disp: 30 tablet, Rfl: 2   Multiple Vitamin (MULTIVITAMIN WITH MINERALS) TABS tablet, Take 1 tablet by mouth daily., Disp: , Rfl:    potassium chloride  (KLOR-CON  10) 10 MEQ tablet, Take 1 tablet (10 mEq total) by mouth daily. With lasix ., Disp: 30 tablet, Rfl: 2   triamcinolone  cream (KENALOG ) 0.1 %, Apply 1 Application topically 2 (two) times daily. To affected area till better, Disp: 80 g, Rfl: 0  Social History   Tobacco Use  Smoking Status Never   Smokeless Tobacco Never    Allergies  Allergen Reactions   Penicillins Itching and Rash    Itching Has patient had a PCN reaction causing immediate rash, facial/tongue/throat swelling, SOB or lightheadedness with hypotension:YES Has patient had a PCN reaction causing severe rash involving mucus membranes or skin necrosis: NO Has patient had a PCN reaction that required hospitalization NO Has patient had a PCN reaction occurring within the last 10 years: NO If all of the above answers are NO, then may proceed with Cephalosporin use.    Objective:  There were no vitals filed for this visit. There is no height or weight on file to calculate BMI. Constitutional Well developed. Well nourished.  Vascular Dorsalis pedis pulses palpable bilaterally. Posterior tibial pulses palpable bilaterally. Capillary refill normal to all digits.  No cyanosis or clubbing noted. Pedal hair growth normal.  Neurologic Normal speech. Oriented to person, place, and time. Epicritic sensation to light touch grossly present bilaterally.  Dermatologic Nails well groomed and normal in appearance. No open wounds. No skin lesions.  Orthopedic: Gait examination shows pes planovalgus deformity with calcaneal valgus to many toe signs partially buried.  The arch with dorsiflexion of the hallux unable to perform single and double heel raise.   Radiographs: None Assessment:   1. Pes planovalgus  2. Foot deformity, bilateral    Plan:  Patient was evaluated and treated and all questions answered.  Pes planovalgus/foot deformity -I explained to patient the etiology of pes planovalgus and relationship with Planter fasciitis and various treatment options were discussed.  Given patient foot structure in the setting of Planter fasciitis I believe patient will benefit from custom-made orthotics to help control the hindfoot motion support the arch of the foot and take the stress away from plantar fascial.  Patient  agrees with the plan like to proceed with orthotics -Patient was casted for orthotics   No follow-ups on file.

## 2024-02-08 ENCOUNTER — Telehealth: Payer: Self-pay

## 2024-02-08 NOTE — Telephone Encounter (Signed)
 LVM to schedule orthotic fitting/ pu  Orthotics in [employer]  Balance for orthotics: $0

## 2024-02-11 ENCOUNTER — Ambulatory Visit

## 2024-05-06 ENCOUNTER — Other Ambulatory Visit: Payer: Self-pay

## 2024-05-06 DIAGNOSIS — Z1231 Encounter for screening mammogram for malignant neoplasm of breast: Secondary | ICD-10-CM

## 2024-05-09 ENCOUNTER — Telehealth: Payer: Self-pay

## 2024-05-09 NOTE — Telephone Encounter (Signed)
 Called patient to have her scheduled to [facility] in [place]. Called patient x2 and both times went to VM. I left a VM requesting the patient give our office a call back and to be scheduled to [facility].

## 2024-05-29 ENCOUNTER — Ambulatory Visit: Admission: RE | Admit: 2024-05-29 | Discharge: 2024-05-29 | Disposition: A | Source: Ambulatory Visit

## 2024-05-29 DIAGNOSIS — Z1231 Encounter for screening mammogram for malignant neoplasm of breast: Secondary | ICD-10-CM

## 2024-05-30 ENCOUNTER — Ambulatory Visit
Admission: RE | Admit: 2024-05-30 | Discharge: 2024-05-30 | Disposition: A | Discharge status: Home / Self Care | Source: Ambulatory Visit

## 2024-05-30 VITALS — BP 141/88 | HR 75 | Temp 98.2°F | Resp 17

## 2024-05-30 DIAGNOSIS — M7989 Other specified soft tissue disorders: Secondary | ICD-10-CM

## 2024-05-30 NOTE — ED Provider Notes (Signed)
 [facility]    CSN: [id number] Arrival date & time: 05/30/24  9167      History   Chief Complaint Chief Complaint  Patient presents with   Leg Swelling    Swelling in both legs but left fill tight and irritated with pain that is sharp.  I noticed that there was irritation with a shoes that seemed to rub my left ankle that became painful. - Entered by patient    HPI Marie Spears is a 48 y.o. female. She has hx BLE swelling on and off. Used to take lasix  that helped but can't anymore due to her kidney function- has stage II kidney disease and sees nephrology. Denies cardiac disease/HF. She says BLE swelling is better when she exercises, drinks water , and wears support socks. For the last week on and off, her LLE has been more swollen compared to RLE. She is worried about a skin infection that she might have caused by giving self a pedicure/filing off skin with foot file on her ankle/foot. No redness or warmth, not really painful, just tight feeling because of swelling. Swelling is better this morning compared to yesterday.   Denies current calf pain or recent fall/injury.   HPI  Past Medical History:  Diagnosis Date   Asthma    Bronchitis    Diabetes mellitus without complication (HCC)    DVT (deep venous thrombosis) (HCC)    Obesity     Patient Active Problem List   Diagnosis Date Noted   Acute respiratory failure with hypoxia (HCC) 01/03/2022   Multifocal pneumonia 01/03/2022   Right ankle joint deformity 01/03/2022   Polyp of colon 10/15/2020   Acid reflux 07/21/2020   Dysphagia 07/21/2020   Normocytic anemia 07/21/2020   Deep venous thrombosis (HCC) 09/23/2019   Essential hypertension 06/25/2019   History of bariatric surgery 05/27/2019   Mild intermittent asthma without complication 02/06/2019   Asthma 08/03/2018   Status post right cataract extraction 11/29/2017   Age-related nuclear cataract of left eye 10/24/2017   Anterior subcapsular  cataract of right eye 10/24/2017   Posterior subcapsular age-related cataract of both eyes 10/24/2017   Diabetes mellitus (HCC) 07/21/2016   Hyperlipidemia 07/21/2016   Hidradenitis suppurativa 07/20/2016   Prediabetes 07/20/2016   Type 2 diabetes mellitus with obesity 03/11/2015   Morbid obesity (HCC) 03/11/2015    Past Surgical History:  Procedure Laterality Date   BARIATRIC SURGERY  06/23/2019   [facility] 347lbs prior to surgery.   BIOPSY  08/16/2020   Procedure: BIOPSY;  Surgeon: Cindie Carlin POUR, DO;  [facility];  Service: Endoscopy;;   COLONOSCOPY WITH PROPOFOL  N/A 08/16/2020   Procedure: COLONOSCOPY WITH PROPOFOL ;  Surgeon: Cindie Carlin POUR, DO;  [facility];  Service: Endoscopy;  Laterality: N/A;   ESOPHAGOGASTRODUODENOSCOPY (EGD) WITH PROPOFOL  N/A 08/16/2020   Procedure: ESOPHAGOGASTRODUODENOSCOPY (EGD) WITH PROPOFOL ;  Surgeon: Cindie Carlin POUR, DO;  [facility];  Service: Endoscopy;  Laterality: N/A;  8:30am   HERNIA REPAIR     POLYPECTOMY  08/16/2020   Procedure: POLYPECTOMY;  Surgeon: Cindie Carlin POUR, DO;  [facility]: [facility];  Service: Endoscopy;;    OB History   No obstetric history on file.      Home Medications    Prior to Admission medications   Medication Sig Start Date End Date Taking? Authorizing Provider  Vitamin D, Ergocalciferol, (DRISDOL) 1.25 MG (50000 UNIT) CAPS capsule Take 50,000 Units by mouth once a week. 04/19/24  Yes [provider]  albuterol  (VENTOLIN  HFA) 108 (90 Base) MCG/ACT inhaler Inhale 2 puffs into the lungs every 4 (four) hours as needed for wheezing or shortness of breath. 06/15/20   Gosrani, Nimish C, MD  Aspirin-Acetaminophen -Caffeine (GOODY HEADACHE PO) Take 1 packet by mouth 2 (two) times daily as needed (pain).    [provider]  buPROPion (WELLBUTRIN XL) 300 MG 24 hr tablet Take 300 mg by mouth every morning. 08/31/21   [provider]  ferrous sulfate 325 (65 FE) MG  tablet Take 325 mg by mouth every 3 (three) days.    [provider]  furosemide  (LASIX ) 40 MG tablet Take 1 tablet (40 mg total) by mouth daily. Patient not taking: Reported on 05/30/2024 01/05/22   Rojelio Nest, DO  Multiple Vitamin (MULTIVITAMIN WITH MINERALS) TABS tablet Take 1 tablet by mouth daily.    [provider]  OZEMPIC, 2 MG/DOSE, 8 MG/3ML SOPN 2 mg.    [provider]  potassium chloride  (KLOR-CON  10) 10 MEQ tablet Take 1 tablet (10 mEq total) by mouth daily. With lasix . 01/05/22   Rojelio Nest, DO  triamcinolone  cream (KENALOG ) 0.1 % Apply 1 Application topically 2 (two) times daily. To affected area till better Patient not taking: Reported on 05/30/2024 08/18/23   Vonna Sharlet POUR, MD    Family History Family History  Problem Relation Age of Onset   Heart disease Mother    Diabetes Mother    Hypertension Father    Diabetes Sister    Heart attack Maternal Grandfather 64   Diabetes Brother    Breast cancer Neg Hx     Social History Social History   Tobacco Use   Smoking status: Never   Smokeless tobacco: Never  Vaping Use   Vaping status: Never Used  Substance Use Topics   Alcohol use: Yes    Comment: occasionally   Drug use: No     Allergies   Penicillins   Review of Systems Review of Systems   Physical Exam Triage Vital Signs ED Triage Vitals  Encounter Vitals Group     BP 05/30/24 0850 (!) 141/88     Girls Systolic BP Percentile --      Girls Diastolic BP Percentile --      Boys Systolic BP Percentile --      Boys Diastolic BP Percentile --      Pulse Rate 05/30/24 0850 75     Resp 05/30/24 0850 17     Temp 05/30/24 0850 98.2 F (36.8 C)     Temp Source 05/30/24 0850 Oral     SpO2 05/30/24 0850 98 %     Weight --      Height --      Head Circumference --      Peak Flow --      Pain Score 05/30/24 0854 6     Pain Loc --      Pain Education --      Exclude from Growth Chart --    No data found.  Updated  Vital Signs BP (!) 141/88 (BP Location: Right Arm)   Pulse 75   Temp 98.2 F (36.8 C) (Oral)   Resp 17   LMP 05/15/2024 (Approximate)   SpO2 98%   Visual Acuity Right Eye Distance:   Left Eye Distance:   Bilateral Distance:    Right Eye Near:   Left Eye Near:    Bilateral Near:     Physical Exam Constitutional:  Appearance: Normal appearance. She is obese.  Cardiovascular:     Pulses:          Dorsalis pedis pulses are 2+ on the left side.  Musculoskeletal:     Right foot: Swelling present.     Left foot: Swelling present. No tenderness or bony tenderness. Normal pulse.     Comments: L foot/ankle swelling >R and is not pitting edema. She does have 1+ pitting edema on R foot/ankle.   Negative homan's sign on L lower leg/calf  Feet:     Left foot:     Skin integrity: No erythema, warmth or callus.     Toenail Condition: Left toenails are abnormally thick.  Neurological:     Mental Status: She is alert.      UC Treatments / Results  Labs (all labs ordered are listed, but only abnormal results are displayed) Labs Reviewed - No data to display  EKG   Radiology No results found.  Procedures Procedures (including critical care time)  Medications Ordered in UC Medications - No data to display  Initial Impression / Assessment and Plan / UC Course  I have reviewed the triage vital signs and the nursing notes.  Pertinent labs & imaging results that were available during my care of the patient were reviewed by me and considered in my medical decision making (see chart for details).    I do not suspect DVT or cellulitis. No injury. Increased swelling on L may be from treatment of skin during pedicure as pt suspects, but I am not sure. I think most likely she has venous insufficiency. She will f/u with pcp. We discussed supportive care.   Final Clinical Impressions(s) / UC Diagnoses   Final diagnoses:  Leg swelling     Discharge Instructions      You  do not have a skin infection today. I do recommend not using the foot file on the top parts of your foot or your ankle, and only using it on the dead skin on the soles of your feet.   Talk with your primary care provider about your leg swelling - you might benefit from talking with a vascular surgeon (not that you will need surgery! That is just their title).   I think you would benefit from seeing a podiatrist.   Work on the things you know you help your leg swelling: wearing compression socks, drinking enough water , and exercising.    ED Prescriptions   None    PDMP not reviewed this encounter.   Richad Jon HERO, NP 05/30/24 [phone]

## 2024-05-30 NOTE — ED Triage Notes (Signed)
 Pt c/o c/o left leg and ankle swelling, and stabbing ankle/foot pain for 1 week.  States she had abrasion on ankle from shoes rubbing last week.

## 2024-05-30 NOTE — Discharge Instructions (Signed)
 You do not have a skin infection today. I do recommend not using the foot file on the top parts of your foot or your ankle, and only using it on the dead skin on the soles of your feet.   Talk with your primary care provider about your leg swelling - you might benefit from talking with a vascular surgeon (not that you will need surgery! That is just their title).   I think you would benefit from seeing a podiatrist.   Work on the things you know you help your leg swelling: wearing compression socks, drinking enough water , and exercising.

## 2024-07-24 ENCOUNTER — Telehealth: Payer: Self-pay | Admitting: Podiatry

## 2024-07-24 NOTE — Telephone Encounter (Signed)
 Orthotics are in [facility] called pt to schedule her an appt, pt stated she was at work and hung up. No appt made.
